# Patient Record
Sex: Male | Born: 1955 | Race: White | Hispanic: No | Marital: Married | Smoking: Current every day smoker
Health system: Southern US, Community
[De-identification: ages and names within clinical notes are randomized; demographics above are authoritative.]

## PROBLEM LIST (undated history)

## (undated) DIAGNOSIS — G4730 Sleep apnea, unspecified: Secondary | ICD-10-CM

## (undated) DIAGNOSIS — I10 Essential (primary) hypertension: Secondary | ICD-10-CM

## (undated) DIAGNOSIS — I890 Lymphedema, not elsewhere classified: Secondary | ICD-10-CM

## (undated) DIAGNOSIS — R000 Tachycardia, unspecified: Secondary | ICD-10-CM

## (undated) DIAGNOSIS — J449 Chronic obstructive pulmonary disease, unspecified: Secondary | ICD-10-CM

## (undated) DIAGNOSIS — E119 Type 2 diabetes mellitus without complications: Secondary | ICD-10-CM

## (undated) DIAGNOSIS — E785 Hyperlipidemia, unspecified: Secondary | ICD-10-CM

## (undated) DIAGNOSIS — I509 Heart failure, unspecified: Secondary | ICD-10-CM

## (undated) DIAGNOSIS — I2510 Atherosclerotic heart disease of native coronary artery without angina pectoris: Secondary | ICD-10-CM

## (undated) DIAGNOSIS — I872 Venous insufficiency (chronic) (peripheral): Secondary | ICD-10-CM

## (undated) HISTORY — PX: CARDIAC CATHETERIZATION: SHX172

## (undated) HISTORY — PX: ADENOIDECTOMY: SUR15

## (undated) HISTORY — PX: TONSILLECTOMY: SUR1361

## (undated) HISTORY — PX: ANGIOPLASTY: SHX39

## (undated) HISTORY — PX: HERNIA REPAIR: SHX51

## (undated) HISTORY — PX: APPENDECTOMY: SHX54

---

## 2007-03-15 ENCOUNTER — Emergency Department: Payer: Self-pay | Admitting: Internal Medicine

## 2007-03-15 ENCOUNTER — Other Ambulatory Visit: Payer: Self-pay

## 2007-03-17 ENCOUNTER — Emergency Department: Payer: Self-pay | Admitting: Emergency Medicine

## 2007-04-02 ENCOUNTER — Inpatient Hospital Stay: Payer: Self-pay | Admitting: Internal Medicine

## 2007-04-13 ENCOUNTER — Inpatient Hospital Stay: Payer: Self-pay | Admitting: Internal Medicine

## 2007-09-26 ENCOUNTER — Ambulatory Visit: Payer: Self-pay | Admitting: Cardiology

## 2008-04-28 ENCOUNTER — Emergency Department: Payer: Self-pay | Admitting: Emergency Medicine

## 2009-05-31 ENCOUNTER — Emergency Department: Payer: Self-pay | Admitting: Emergency Medicine

## 2009-06-09 ENCOUNTER — Emergency Department: Payer: Self-pay | Admitting: Emergency Medicine

## 2009-07-10 ENCOUNTER — Inpatient Hospital Stay: Payer: Self-pay | Admitting: Internal Medicine

## 2011-08-03 ENCOUNTER — Ambulatory Visit: Payer: Self-pay | Admitting: Family Medicine

## 2013-07-02 ENCOUNTER — Ambulatory Visit: Payer: Self-pay | Admitting: Family Medicine

## 2013-09-24 ENCOUNTER — Ambulatory Visit: Payer: Self-pay | Admitting: Cardiology

## 2014-03-13 ENCOUNTER — Ambulatory Visit: Payer: Self-pay | Admitting: Family Medicine

## 2014-03-13 ENCOUNTER — Emergency Department: Payer: Self-pay | Admitting: Emergency Medicine

## 2014-07-04 ENCOUNTER — Emergency Department: Payer: Medicare Other

## 2014-07-04 ENCOUNTER — Encounter: Payer: Self-pay | Admitting: Emergency Medicine

## 2014-07-04 ENCOUNTER — Emergency Department
Admission: EM | Admit: 2014-07-04 | Discharge: 2014-07-04 | Disposition: A | Discharge status: Home / Self Care | Payer: Medicare Other | Attending: Emergency Medicine | Admitting: Emergency Medicine

## 2014-07-04 DIAGNOSIS — I10 Essential (primary) hypertension: Secondary | ICD-10-CM | POA: Insufficient documentation

## 2014-07-04 DIAGNOSIS — Z720 Tobacco use: Secondary | ICD-10-CM | POA: Insufficient documentation

## 2014-07-04 DIAGNOSIS — L03115 Cellulitis of right lower limb: Secondary | ICD-10-CM | POA: Insufficient documentation

## 2014-07-04 DIAGNOSIS — E11628 Type 2 diabetes mellitus with other skin complications: Secondary | ICD-10-CM | POA: Insufficient documentation

## 2014-07-04 DIAGNOSIS — T148XXA Other injury of unspecified body region, initial encounter: Secondary | ICD-10-CM

## 2014-07-04 DIAGNOSIS — R609 Edema, unspecified: Secondary | ICD-10-CM

## 2014-07-04 DIAGNOSIS — L03119 Cellulitis of unspecified part of limb: Secondary | ICD-10-CM

## 2014-07-04 HISTORY — DX: Hyperlipidemia, unspecified: E78.5

## 2014-07-04 HISTORY — DX: Venous insufficiency (chronic) (peripheral): I87.2

## 2014-07-04 HISTORY — DX: Chronic obstructive pulmonary disease, unspecified: J44.9

## 2014-07-04 HISTORY — DX: Sleep apnea, unspecified: G47.30

## 2014-07-04 HISTORY — DX: Essential (primary) hypertension: I10

## 2014-07-04 HISTORY — DX: Atherosclerotic heart disease of native coronary artery without angina pectoris: I25.10

## 2014-07-04 HISTORY — DX: Type 2 diabetes mellitus without complications: E11.9

## 2014-07-04 HISTORY — DX: Heart failure, unspecified: I50.9

## 2014-07-04 HISTORY — DX: Lymphedema, not elsewhere classified: I89.0

## 2014-07-04 LAB — CBC WITH DIFFERENTIAL/PLATELET
BASOS ABS: 0.1 10*3/uL (ref 0–0.1)
BASOS PCT: 1 %
Eosinophils Absolute: 0.2 10*3/uL (ref 0–0.7)
Eosinophils Relative: 2 %
HCT: 51 % (ref 40.0–52.0)
Hemoglobin: 17.1 g/dL (ref 13.0–18.0)
LYMPHS PCT: 14 %
Lymphs Abs: 1.6 10*3/uL (ref 1.0–3.6)
MCH: 33.3 pg (ref 26.0–34.0)
MCHC: 33.6 g/dL (ref 32.0–36.0)
MCV: 99.4 fL (ref 80.0–100.0)
Monocytes Absolute: 0.8 10*3/uL (ref 0.2–1.0)
Monocytes Relative: 7 %
Neutro Abs: 9 10*3/uL — ABNORMAL HIGH (ref 1.4–6.5)
Neutrophils Relative %: 76 %
Platelets: 139 10*3/uL — ABNORMAL LOW (ref 150–440)
RBC: 5.13 MIL/uL (ref 4.40–5.90)
RDW: 14 % (ref 11.5–14.5)
WBC: 11.7 10*3/uL — AB (ref 3.8–10.6)

## 2014-07-04 LAB — BASIC METABOLIC PANEL
Anion gap: 7 (ref 5–15)
BUN: 13 mg/dL (ref 6–20)
CALCIUM: 8.9 mg/dL (ref 8.9–10.3)
CO2: 26 mmol/L (ref 22–32)
CREATININE: 1.21 mg/dL (ref 0.61–1.24)
Chloride: 105 mmol/L (ref 101–111)
GFR calc Af Amer: >60 mL/min (ref >60)
GFR calc non Af Amer: >60 mL/min (ref >60)
Glucose, Bld: 98 mg/dL (ref 65–99)
POTASSIUM: 4.1 mmol/L (ref 3.5–5.1)
SODIUM: 138 mmol/L (ref 135–145)

## 2014-07-04 MED ORDER — CLINDAMYCIN PHOSPHATE 600 MG/50ML IV SOLN
600.0000 mg | Freq: Once | INTRAVENOUS | Status: AC
  Administered 2014-07-04: 600 mg via INTRAVENOUS

## 2014-07-04 MED ORDER — CLINDAMYCIN PHOSPHATE 600 MG/50ML IV SOLN
INTRAVENOUS | Status: AC
  Administered 2014-07-04: 600 mg via INTRAVENOUS
  Filled 2014-07-04: qty 50

## 2014-07-04 MED ORDER — CLINDAMYCIN HCL 300 MG PO CAPS: 300.0000 mg | ORAL_CAPSULE | Freq: Three times a day (TID) | ORAL | Status: DC

## 2014-07-04 MED ORDER — LEVOFLOXACIN IN D5W 750 MG/150ML IV SOLN
750.0000 mg | Freq: Once | INTRAVENOUS | Status: AC
  Administered 2014-07-04: 750 mg via INTRAVENOUS

## 2014-07-04 MED ORDER — LEVOFLOXACIN 750 MG PO TABS: 750.0000 mg | ORAL_TABLET | Freq: Every day | ORAL | Status: DC

## 2014-07-04 MED ORDER — OXYCODONE-ACETAMINOPHEN 5-325 MG PO TABS: 1.0000 | ORAL_TABLET | Freq: Four times a day (QID) | ORAL | Status: DC | PRN

## 2014-07-04 MED ORDER — LEVOFLOXACIN IN D5W 750 MG/150ML IV SOLN
INTRAVENOUS | Status: AC
  Administered 2014-07-04: 750 mg via INTRAVENOUS
  Filled 2014-07-04: qty 150

## 2014-07-04 NOTE — Discharge Instructions (Signed)
Follow up with podiatry, your primary care provider, or return here in 2 days. Because it is a holiday, you will likely have to return here. Return sooner for symptoms that worsen. Take the antibiotics without skipping doses.

## 2014-07-04 NOTE — ED Notes (Signed)
Pt to ed with c/o right foot swelling since Sunday, states he stepped on barbed wire on Sunday and it punctured his right foot.  Pt states since then he has had swelling, redness and warm to touch skin.  Pt was seen at [facility] office today and sent here from there. Pt with extensive medical hx.

## 2014-07-04 NOTE — ED Notes (Signed)
Pt. Did not want pain medication ordered for discharge.  Pt. States he has enough pain medication at home.

## 2014-07-04 NOTE — ED Provider Notes (Signed)
[facility] Emergency Department Provider Note ____________________________________________  Time seen: Approximately 4:35 PM  I have reviewed the triage vital signs and the nursing notes.   HISTORY  Chief Complaint Foot Swelling   HPI Fernando [name] is a 59 y.o. male who presents to the emergency department for pain in the right foot after stepping on a piece of barbed wire on Sunday. He states the swelling and tenderness has gradually worsened over the last few days. He was sent to the emergency room by his PCP. Pain has not been well controlled by medications at home.   Past Medical History  Diagnosis Date  . CAD (coronary artery disease)   . Hyperlipidemia   . Sleep apnea   . COPD (chronic obstructive pulmonary disease)   . CHF (congestive heart failure)   . Hypertension   . Diabetes mellitus without complication   . Lymphedema   . Venous insufficiency     There are no active problems to display for this patient.   History reviewed. No pertinent past surgical history.  Current Outpatient Rx  Name  Route  Sig  Dispense  Refill  . clindamycin (CLEOCIN) 300 MG capsule   Oral   Take 1 capsule (300 mg total) by mouth 3 (three) times daily.   30 capsule   0   . levofloxacin (LEVAQUIN) 750 MG tablet   Oral   Take 1 tablet (750 mg total) by mouth daily.   7 tablet   0   . oxyCODONE-acetaminophen (ROXICET) 5-325 MG per tablet   Oral   Take 1 tablet by mouth every 6 (six) hours as needed.   9 tablet   0     Allergies Augmentin; Codeine; and Other  Family History  Problem Relation Age of Onset  . Cancer Father     Social History History  Substance Use Topics  . Smoking status: Current Every Day Smoker  . Smokeless tobacco: Not on file  . Alcohol Use: No    Review of Systems   Constitutional: No fever/chills Eyes: No visual changes. ENT: No rhinorrhea or sore throat. Cardiovascular: Denies chest pain. Respiratory: Denies  shortness of breath. Gastrointestinal: No abdominal pain.  No nausea, no vomiting.  No diarrhea.  No constipation. Genitourinary: Negative for dysuria. Musculoskeletal: Negative for back pain. Skin: Pain, redness, and swelling to right foot.  Neurological: Negative for headaches, focal weakness or numbness.  10-point ROS otherwise negative.  ____________________________________________   PHYSICAL EXAM:  VITAL SIGNS: ED Triage Vitals  Enc Vitals Group     BP 07/04/14 1506 119/68 mmHg     Pulse Rate 07/04/14 1506 61     Resp 07/04/14 1506 18     Temp 07/04/14 1506 98.4 F (36.9 C)     Temp Source 07/04/14 1506 Oral     SpO2 07/04/14 1506 96 %     Weight 07/04/14 1506 277 lb (125.646 kg)     Height 07/04/14 1506  (1.626 m)     Head Cir --      Peak Flow --      Pain Score 07/04/14 1506 8     Pain Loc --      Pain Edu? --      Excl. in GC? --     Constitutional: Alert and oriented. Chronically ill appearing and in no acute distress. Eyes: Conjunctivae are normal. PERRL. EOMI. Head: Atraumatic. Nose: No congestion/rhinnorhea. Mouth/Throat: Mucous membranes are moist.  Oropharynx non-erythematous. No oral lesions. Neck: No  stridor. Cardiovascular: Normal rate, regular rhythm. Poor vascular status that is chronic with associated skin changes. DP pulse palpable bilaterally. Respiratory: Respiratory effort at baseline. Gastrointestinal: Soft and nontender. Chronically distended. No abdominal bruits.  Musculoskeletal: Chronic lower extremity  edema.  No joint effusions. Neurologic:  Normal speech and language. No gross focal neurologic deficits are appreciated. Speech is normal. No gait instability. Skin:  Rash no; Erythema yes, plantar surface of foot; Negative for petechiae.  Psychiatric: Mood and affect are normal. Speech and behavior are normal.  ____________________________________________   LABS (all labs ordered are listed, but only abnormal results are  displayed)  Labs Reviewed  CBC WITH DIFFERENTIAL/PLATELET - Abnormal; Notable for the following:    WBC 11.7 (*)    Platelets 139 (*)    Neutro Abs 9.0 (*)    All other components within normal limits  CULTURE, BLOOD (ROUTINE X 2)  CULTURE, BLOOD (ROUTINE X 2)  BASIC METABOLIC PANEL   ____________________________________________  EKG   ____________________________________________  RADIOLOGY  No foreign body or bony abnormality. ____________________________________________   PROCEDURES  Procedure(s) performed: None ____________________________________________   INITIAL IMPRESSION / ASSESSMENT AND PLAN / ED COURSE  Pertinent labs & imaging results that were available during my care of the patient were reviewed by me and considered in my medical decision making (see chart for details).  Patient was advised to see the podiatrist, PCP, or return here in 2 days for a recheck. He was advised to return sooner for symptoms that worsen. He was advised to take the antibiotics as scheduled without skipping doses. He will have Rx. For both Levaquin and Clindamycin. He is allergic to PCN/Augmentin. ____________________________________________   FINAL CLINICAL IMPRESSION(S) / ED DIAGNOSES  Final diagnoses:  Cellulitis in diabetic foot       Chinita PesterCari B Elianys Conry, FNP 07/04/14 2025  Loleta Roseory Forbach, MD 07/04/14 2109

## 2014-07-07 ENCOUNTER — Inpatient Hospital Stay
Admission: EM | Admit: 2014-07-07 | Discharge: 2014-07-10 | DRG: 603 | Disposition: A | Discharge status: Home / Self Care | Payer: Medicare Other | Attending: Internal Medicine | Admitting: Internal Medicine

## 2014-07-07 ENCOUNTER — Encounter: Payer: Self-pay | Admitting: Emergency Medicine

## 2014-07-07 ENCOUNTER — Emergency Department: Payer: Medicare Other

## 2014-07-07 DIAGNOSIS — I890 Lymphedema, not elsewhere classified: Secondary | ICD-10-CM | POA: Diagnosis present

## 2014-07-07 DIAGNOSIS — W458XXA Other foreign body or object entering through skin, initial encounter: Secondary | ICD-10-CM | POA: Diagnosis present

## 2014-07-07 DIAGNOSIS — J449 Chronic obstructive pulmonary disease, unspecified: Secondary | ICD-10-CM | POA: Diagnosis present

## 2014-07-07 DIAGNOSIS — S91339A Puncture wound without foreign body, unspecified foot, initial encounter: Secondary | ICD-10-CM

## 2014-07-07 DIAGNOSIS — L03119 Cellulitis of unspecified part of limb: Secondary | ICD-10-CM

## 2014-07-07 DIAGNOSIS — I10 Essential (primary) hypertension: Secondary | ICD-10-CM | POA: Diagnosis present

## 2014-07-07 DIAGNOSIS — Z9109 Other allergy status, other than to drugs and biological substances: Secondary | ICD-10-CM | POA: Diagnosis not present

## 2014-07-07 DIAGNOSIS — E785 Hyperlipidemia, unspecified: Secondary | ICD-10-CM | POA: Diagnosis present

## 2014-07-07 DIAGNOSIS — Z79899 Other long term (current) drug therapy: Secondary | ICD-10-CM

## 2014-07-07 DIAGNOSIS — M7989 Other specified soft tissue disorders: Secondary | ICD-10-CM | POA: Diagnosis present

## 2014-07-07 DIAGNOSIS — E119 Type 2 diabetes mellitus without complications: Secondary | ICD-10-CM | POA: Diagnosis present

## 2014-07-07 DIAGNOSIS — Z7982 Long term (current) use of aspirin: Secondary | ICD-10-CM

## 2014-07-07 DIAGNOSIS — I2510 Atherosclerotic heart disease of native coronary artery without angina pectoris: Secondary | ICD-10-CM | POA: Diagnosis present

## 2014-07-07 DIAGNOSIS — M609 Myositis, unspecified: Secondary | ICD-10-CM | POA: Diagnosis present

## 2014-07-07 DIAGNOSIS — I739 Peripheral vascular disease, unspecified: Secondary | ICD-10-CM | POA: Diagnosis present

## 2014-07-07 DIAGNOSIS — Z9119 Patient's noncompliance with other medical treatment and regimen: Secondary | ICD-10-CM | POA: Diagnosis present

## 2014-07-07 DIAGNOSIS — Z885 Allergy status to narcotic agent status: Secondary | ICD-10-CM

## 2014-07-07 DIAGNOSIS — F17210 Nicotine dependence, cigarettes, uncomplicated: Secondary | ICD-10-CM | POA: Diagnosis present

## 2014-07-07 DIAGNOSIS — G4730 Sleep apnea, unspecified: Secondary | ICD-10-CM | POA: Diagnosis present

## 2014-07-07 DIAGNOSIS — I509 Heart failure, unspecified: Secondary | ICD-10-CM | POA: Diagnosis present

## 2014-07-07 DIAGNOSIS — Z881 Allergy status to other antibiotic agents status: Secondary | ICD-10-CM

## 2014-07-07 DIAGNOSIS — I872 Venous insufficiency (chronic) (peripheral): Secondary | ICD-10-CM | POA: Diagnosis present

## 2014-07-07 DIAGNOSIS — L03115 Cellulitis of right lower limb: Secondary | ICD-10-CM | POA: Diagnosis not present

## 2014-07-07 LAB — COMPREHENSIVE METABOLIC PANEL
ALBUMIN: 3.8 g/dL (ref 3.5–5.0)
ALT: 37 U/L (ref 17–63)
ANION GAP: 8 (ref 5–15)
AST: 33 U/L (ref 15–41)
Alkaline Phosphatase: 109 U/L (ref 38–126)
BILIRUBIN TOTAL: 0.6 mg/dL (ref 0.3–1.2)
BUN: 15 mg/dL (ref 6–20)
CHLORIDE: 101 mmol/L (ref 101–111)
CO2: 30 mmol/L (ref 22–32)
Calcium: 9.5 mg/dL (ref 8.9–10.3)
Creatinine, Ser: 1.13 mg/dL (ref 0.61–1.24)
GLUCOSE: 104 mg/dL — AB (ref 65–99)
POTASSIUM: 3.9 mmol/L (ref 3.5–5.1)
SODIUM: 139 mmol/L (ref 135–145)
TOTAL PROTEIN: 8 g/dL (ref 6.5–8.1)

## 2014-07-07 LAB — CBC
HCT: 52.6 % — ABNORMAL HIGH (ref 40.0–52.0)
HEMOGLOBIN: 17.8 g/dL (ref 13.0–18.0)
MCH: 33.5 pg (ref 26.0–34.0)
MCHC: 33.9 g/dL (ref 32.0–36.0)
MCV: 98.8 fL (ref 80.0–100.0)
PLATELETS: 154 10*3/uL (ref 150–440)
RBC: 5.32 MIL/uL (ref 4.40–5.90)
RDW: 14 % (ref 11.5–14.5)
WBC: 8.1 10*3/uL (ref 3.8–10.6)

## 2014-07-07 LAB — GLUCOSE, CAPILLARY
GLUCOSE-CAPILLARY: 107 mg/dL — AB (ref 65–99)
Glucose-Capillary: 104 mg/dL — ABNORMAL HIGH (ref 65–99)

## 2014-07-07 MED ORDER — NICOTINE 10 MG IN INHA
RESPIRATORY_TRACT | Status: AC
  Administered 2014-07-07: 1 via RESPIRATORY_TRACT
  Filled 2014-07-07: qty 36

## 2014-07-07 MED ORDER — ACETAMINOPHEN 325 MG PO TABS: 650.0000 mg | ORAL_TABLET | Freq: Four times a day (QID) | ORAL | Status: DC | PRN

## 2014-07-07 MED ORDER — VANCOMYCIN HCL IN DEXTROSE 1-5 GM/200ML-% IV SOLN
1000.0000 mg | Freq: Two times a day (BID) | INTRAVENOUS | Status: AC
  Administered 2014-07-08 – 2014-07-09 (×4): 1000 mg via INTRAVENOUS
  Filled 2014-07-07 (×6): qty 200

## 2014-07-07 MED ORDER — NICOTINE 21 MG/24HR TD PT24
21.0000 mg | MEDICATED_PATCH | Freq: Every day | TRANSDERMAL | Status: DC
  Filled 2014-07-07 (×2): qty 1

## 2014-07-07 MED ORDER — CEFAZOLIN SODIUM 1-5 GM-% IV SOLN
1.0000 g | Freq: Three times a day (TID) | INTRAVENOUS | Status: DC
  Administered 2014-07-07 – 2014-07-10 (×8): 1 g via INTRAVENOUS
  Filled 2014-07-07 (×12): qty 50

## 2014-07-07 MED ORDER — POTASSIUM CHLORIDE CRYS ER 10 MEQ PO TBCR
10.0000 meq | EXTENDED_RELEASE_TABLET | Freq: Every day | ORAL | Status: DC
  Administered 2014-07-08 – 2014-07-10 (×3): 10 meq via ORAL
  Filled 2014-07-07 (×3): qty 1

## 2014-07-07 MED ORDER — SODIUM CHLORIDE 0.9 % IJ SOLN
3.0000 mL | Freq: Two times a day (BID) | INTRAMUSCULAR | Status: DC
  Administered 2014-07-07 – 2014-07-10 (×6): 3 mL via INTRAVENOUS

## 2014-07-07 MED ORDER — SODIUM CHLORIDE 0.9 % IV SOLN: 250.0000 mL | INTRAVENOUS | Status: DC | PRN

## 2014-07-07 MED ORDER — VANCOMYCIN HCL IN DEXTROSE 1-5 GM/200ML-% IV SOLN
1000.0000 mg | INTRAVENOUS | Status: AC
  Administered 2014-07-07: 1000 mg via INTRAVENOUS

## 2014-07-07 MED ORDER — ONDANSETRON HCL 4 MG/2ML IJ SOLN: 4.0000 mg | Freq: Four times a day (QID) | INTRAMUSCULAR | Status: DC | PRN

## 2014-07-07 MED ORDER — NICOTINE 10 MG IN INHA
1.0000 | RESPIRATORY_TRACT | Status: DC | PRN
  Administered 2014-07-07: 1 via RESPIRATORY_TRACT

## 2014-07-07 MED ORDER — VANCOMYCIN HCL IN DEXTROSE 1-5 GM/200ML-% IV SOLN
INTRAVENOUS | Status: AC
  Administered 2014-07-07: 1000 mg via INTRAVENOUS
  Filled 2014-07-07: qty 200

## 2014-07-07 MED ORDER — DIGOXIN 125 MCG PO TABS
125.0000 ug | ORAL_TABLET | Freq: Every day | ORAL | Status: DC
  Administered 2014-07-08 – 2014-07-10 (×3): 125 ug via ORAL
  Filled 2014-07-07 (×3): qty 1

## 2014-07-07 MED ORDER — TIOTROPIUM BROMIDE MONOHYDRATE 18 MCG IN CAPS
18.0000 ug | ORAL_CAPSULE | Freq: Every day | RESPIRATORY_TRACT | Status: DC
  Administered 2014-07-08 – 2014-07-10 (×3): 18 ug via RESPIRATORY_TRACT
  Filled 2014-07-07: qty 5

## 2014-07-07 MED ORDER — LOSARTAN POTASSIUM 50 MG PO TABS
50.0000 mg | ORAL_TABLET | Freq: Every day | ORAL | Status: DC
  Administered 2014-07-08 – 2014-07-10 (×3): 50 mg via ORAL
  Filled 2014-07-07 (×3): qty 1

## 2014-07-07 MED ORDER — SODIUM CHLORIDE 0.9 % IJ SOLN
3.0000 mL | INTRAMUSCULAR | Status: DC | PRN
  Administered 2014-07-08 – 2014-07-09 (×2): 3 mL via INTRAVENOUS
  Filled 2014-07-07 (×2): qty 10

## 2014-07-07 MED ORDER — ONDANSETRON HCL 4 MG PO TABS: 4.0000 mg | ORAL_TABLET | Freq: Four times a day (QID) | ORAL | Status: DC | PRN

## 2014-07-07 MED ORDER — CARVEDILOL 3.125 MG PO TABS
3.1250 mg | ORAL_TABLET | Freq: Two times a day (BID) | ORAL | Status: DC
  Administered 2014-07-08 – 2014-07-10 (×5): 3.125 mg via ORAL
  Filled 2014-07-07 (×6): qty 1

## 2014-07-07 MED ORDER — CEFAZOLIN SODIUM 1-5 GM-% IV SOLN
1.0000 g | Freq: Once | INTRAVENOUS | Status: AC
  Administered 2014-07-07: 1 g via INTRAVENOUS

## 2014-07-07 MED ORDER — TRIAMCINOLONE ACETONIDE 0.1 % EX CREA
1.0000 "application " | TOPICAL_CREAM | Freq: Every day | CUTANEOUS | Status: DC | PRN
  Filled 2014-07-07: qty 15

## 2014-07-07 MED ORDER — ASPIRIN EC 81 MG PO TBEC
81.0000 mg | DELAYED_RELEASE_TABLET | Freq: Every day | ORAL | Status: DC
  Administered 2014-07-08 – 2014-07-10 (×3): 81 mg via ORAL
  Filled 2014-07-07 (×3): qty 1

## 2014-07-07 MED ORDER — INSULIN ASPART 100 UNIT/ML ~~LOC~~ SOLN: 0.0000 [IU] | Freq: Three times a day (TID) | SUBCUTANEOUS | Status: DC

## 2014-07-07 MED ORDER — NICOTINE 21 MG/24HR TD PT24
MEDICATED_PATCH | TRANSDERMAL | Status: AC
  Filled 2014-07-07: qty 1

## 2014-07-07 MED ORDER — ACETAMINOPHEN 650 MG RE SUPP: 650.0000 mg | Freq: Four times a day (QID) | RECTAL | Status: DC | PRN

## 2014-07-07 MED ORDER — ENOXAPARIN SODIUM 40 MG/0.4ML ~~LOC~~ SOLN
40.0000 mg | SUBCUTANEOUS | Status: DC
  Administered 2014-07-08 – 2014-07-10 (×3): 40 mg via SUBCUTANEOUS
  Filled 2014-07-07 (×3): qty 0.4

## 2014-07-07 MED ORDER — CEFAZOLIN SODIUM 1-5 GM-% IV SOLN
INTRAVENOUS | Status: AC
  Administered 2014-07-07: 1 g via INTRAVENOUS
  Filled 2014-07-07: qty 50

## 2014-07-07 NOTE — Progress Notes (Signed)
ANTIBIOTIC CONSULT NOTE - INITIAL  Pharmacy Consult for Vancomycin Indication: Cellulitis   Allergies  Allergen Reactions  . Augmentin [Amoxicillin-Pot Clavulanate] Hives  . Codeine Hives  . Other Rash    methialate    Patient Measurements: Height: 5\' 4"  (162.6 cm) Weight: 277 lb (125.646 kg) IBW/kg (Calculated) : 59.2 Adjusted Body Weight: 86 kg   Vital Signs: Temp: 98.1 F (36.7 C) (07/04 1000) Temp Source: Oral (07/04 1000) BP: 127/63 mmHg (07/04 1000) Pulse Rate: 84 (07/04 1000) Intake/Output from previous day:   Intake/Output from this shift:    Labs:  Recent Labs  07/04/14 1520 07/07/14 1054  WBC 11.7* 8.1  HGB 17.1 17.8  PLT 139* 154  CREATININE 1.21 1.13   Estimated Creatinine Clearance: 85.4 mL/min (by C-G formula based on Cr of 1.13). No results for input(s): VANCOTROUGH, VANCOPEAK, VANCORANDOM, GENTTROUGH, GENTPEAK, GENTRANDOM, TOBRATROUGH, TOBRAPEAK, TOBRARND, AMIKACINPEAK, AMIKACINTROU, AMIKACIN in the last 72 hours.   Microbiology: Recent Results (from the past 720 hour(s))  Blood culture (routine x 2)     Status: None (Preliminary result)   Collection Time: 07/04/14  3:20 PM  Result Value Ref Range Status   Specimen Description BLOOD  Final   Special Requests Normal  Final   Culture NO GROWTH 3 DAYS  Final   Report Status PENDING  Incomplete  Blood culture (routine x 2)     Status: None (Preliminary result)   Collection Time: 07/04/14  3:30 PM  Result Value Ref Range Status   Specimen Description BLOOD  Final   Special Requests Normal  Final   Culture NO GROWTH 3 DAYS  Final   Report Status PENDING  Incomplete    Medical History: Past Medical History  Diagnosis Date  . CAD (coronary artery disease)   . Hyperlipidemia   . Sleep apnea   . COPD (chronic obstructive pulmonary disease)   . CHF (congestive heart failure)   . Hypertension   . Diabetes mellitus without complication   . Lymphedema   . Venous insufficiency      Medications:  Scheduled:  . insulin aspart  0-9 Units Subcutaneous TID WC  . nicotine  21 mg Transdermal Daily   Assessment: PK parameters  Ke= 0.075; t1/2: 10 hours; VD: 60.2 L   Patient being treated for cellulitis. Patient is also being treated with cefazolin. Per MD note would like patient on both for now.   Goal of Therapy:  Vancomycin trough level 10-15 mcg/ml  Plan:   Will give Vancomycin 1000 mg IV x 1 then will start Vancomycin 1000 mg IV q 12 hours. Will order Vancomycin trough level prior to the 1000 dose on 7/6.  Follow up culture results  Tres Grzywacz D 07/07/2014,2:43 PM

## 2014-07-07 NOTE — Plan of Care (Signed)
Problem: Discharge Progression Outcomes Goal: Other Discharge Outcomes/Goals Outcome: Progressing Lives at home with his wife. Hx of CAD, PVD, borderline DM, COPD, CHF, HTN, lytic chronic lymphedema, and current smoker controlled with home meds.  Plan of care progress to goal: Cellulitis: IV ABX infusing. Pts foot is red and warm to touch. 7/10 pain, controlled with tylenol. Afebrile. Wife at the bedside.

## 2014-07-07 NOTE — ED Provider Notes (Signed)
[facility] Emergency Department Provider Note  ____________________________________________  Time seen: On arrival  I have reviewed the triage vital signs and the nursing notes.   HISTORY  Chief Complaint Foot Pain and Leg Swelling    HPI Kandis MannanStephen Pettinger is a 59 y.o. male with multiple medical problems who presents with right foot pain. Patient recently seen after stepping on a piece of barb wire and was put on antibiotics approximately 3 days ago. He reports swelling has worsened and pain has worsened. He denies fevers chills. The pain is limited to the top of his foot . He has a history of peripheral vascular disease     Past Medical History  Diagnosis Date  . CAD (coronary artery disease)   . Hyperlipidemia   . Sleep apnea   . COPD (chronic obstructive pulmonary disease)   . CHF (congestive heart failure)   . Hypertension   . Diabetes mellitus without complication   . Lymphedema   . Venous insufficiency     There are no active problems to display for this patient.   History reviewed. No pertinent past surgical history.  Current Outpatient Rx  Name  Route  Sig  Dispense  Refill  . clindamycin (CLEOCIN) 300 MG capsule   Oral   Take 1 capsule (300 mg total) by mouth 3 (three) times daily.   30 capsule   0   . levofloxacin (LEVAQUIN) 750 MG tablet   Oral   Take 1 tablet (750 mg total) by mouth daily.   7 tablet   0   . oxyCODONE-acetaminophen (ROXICET) 5-325 MG per tablet   Oral   Take 1 tablet by mouth every 6 (six) hours as needed.   9 tablet   0     Allergies Augmentin; Codeine; and Other  Family History  Problem Relation Age of Onset  . Cancer Father     Social History History  Substance Use Topics  . Smoking status: Current Every Day Smoker -- 2.00 packs/day    Types: Cigarettes  . Smokeless tobacco: Not on file  . Alcohol Use: No    Review of Systems  Constitutional: Negative for fever. Eyes: Negative for  visual changes. ENT: Negative for sore throat Cardiovascular: Negative for chest pain. Respiratory: Chronic shortness of breath Gastrointestinal: Negative for abdominal pain, vomiting and diarrhea. Genitourinary: Negative for dysuria. Musculoskeletal: Negative for back pain. Skin: Erythema to foot Neurological: Negative for headaches or focal weakness Psychiatric: No anxiety  10-point ROS otherwise negative.  ____________________________________________   PHYSICAL EXAM:  VITAL SIGNS: ED Triage Vitals  Enc Vitals Group     BP 07/07/14 1000 127/63 mmHg     Pulse Rate 07/07/14 1000 84     Resp 07/07/14 1000 20     Temp 07/07/14 1000 98.1 F (36.7 C)     Temp Source 07/07/14 1000 Oral     SpO2 07/07/14 1000 98 %     Weight 07/07/14 1000 277 lb (125.646 kg)     Height 07/07/14 1000 5\' 4"  (1.626 m)     Head Cir --      Peak Flow --      Pain Score 07/07/14 1002 8     Pain Loc --      Pain Edu? --      Excl. in GC? --      Constitutional: Alert and oriented. Chronically ill-appearing Eyes: Conjunctivae are normal.  ENT   Head: Normocephalic and atraumatic.   Mouth/Throat: Mucous membranes are  moist. Cardiovascular: Normal rate, regular rhythm. Normal and symmetric distal pulses are present in all extremities. No murmurs, rubs, or gallops. Respiratory: Normal respiratory effort without tachypnea nor retractions. Breath sounds are clear and equal bilaterally.  Gastrointestinal: Soft and non-tender in all quadrants. No distention. There is no CVA tenderness. Genitourinary: deferred Musculoskeletal: Nontender with normal range of motion in all extremities. Mild swelling to the dorsal surface of the right forefoot, tenderness to palpation although no fluctuance. Erythema to the area Neurologic:  Normal speech and language. No gross focal neurologic deficits are appreciated. Skin:  Skin is warm, dry and intact. As above Psychiatric: Mood and affect are normal. Patient  exhibits appropriate insight and judgment.  ____________________________________________    LABS (pertinent positives/negatives)  Labs Reviewed  CBC - Abnormal; Notable for the following:    HCT 52.6 (*)    All other components within normal limits  COMPREHENSIVE METABOLIC PANEL - Abnormal; Notable for the following:    Glucose, Bld 104 (*)    All other components within normal limits    ____________________________________________   EKG  None  ____________________________________________    RADIOLOGY I have personally reviewed any xrays that were ordered on this patient: X-ray shows worsening swelling the dorsum of the foot  ____________________________________________   PROCEDURES  Procedure(s) performed: none  Critical Care performed: none  ____________________________________________   INITIAL IMPRESSION / ASSESSMENT AND PLAN / ED COURSE  Pertinent labs & imaging results that were available during my care of the patient were reviewed by me and considered in my medical decision making (see chart for details).  Consideration for worsening cellulitis despite by mouth antibiotics. We will check labs and repeat x-ray ----------------------------------------- 1:22 PM on 07/07/2014 -----------------------------------------  Labs unremarkable however patient has failed outpatient treatment for antibiotics given his diabetes and multiple comorbidities feel he needs to be admitted for IV antibiotics. We will start IV Ancef ____________________________________________   FINAL CLINICAL IMPRESSION(S) / ED DIAGNOSES  Final diagnoses:  Cellulitis and abscess of foot     Jene Every, MD 07/07/14 1325

## 2014-07-07 NOTE — Plan of Care (Signed)
Problem: Discharge Progression Outcomes Goal: Discharge plan in place and appropriate Individualization: Lives at home with his wife. Hx of CAD, PVD, borderline DM, COPD, CHF, HTN, lytic chronic lymphedema, and current smoker controlled with home meds

## 2014-07-07 NOTE — H&P (Addendum)
[facility] [place] [facility]   PATIENT NAME: Fernando [name]    MR#:  [id number]  DATE OF BIRTH:  12/04/1955  DATE OF ADMISSION:  07/07/2014  PRIMARY CARE PHYSICIAN: Rolm Gala, MD   REQUESTING/REFERRING PHYSICIAN:  Jene Every  CHIEF COMPLAINT:   Chief Complaint  Patient presents with  . Foot Pain  . Leg Swelling    HISTORY OF PRESENT ILLNESS: Fernando [name]  is a 59 y.o. male with a known history of  coronary artery disease, hyperlipidemia, sleep apnea, COPD, congestive heart failure, hypertension, diabetes borderline, lytic chronic lymphedema, and venous insufficiency who was seen in the emergency room a few days ago after he had stepped on a barb wire and had noticed swelling of his foot. Patient was discharged home on clindamycin and ofloxacin. He continued to have worsening pain and swelling comes back to the emergency room with the right foot being swollen. And warm to touch. He does have history of chronic lymphedema and usually wears Velcro around his lower leg bilaterally. He denies any fevers chills no chest pain or shortness of breath PAST MEDICAL HISTORY:   Past Medical History  Diagnosis Date  . CAD (coronary artery disease)   . Hyperlipidemia   . Sleep apnea   . COPD (chronic obstructive pulmonary disease)   . CHF (congestive heart failure)   . Hypertension   . Diabetes mellitus without complication   . Lymphedema   . Venous insufficiency     PAST SURGICAL HISTORY: History reviewed. No pertinent past surgical history.  SOCIAL HISTORY:  History  Substance Use Topics  . Smoking status: Current Every Day Smoker -- 2.00 packs/day    Types: Cigarettes  . Smokeless tobacco: Not on file  . Alcohol Use: No    FAMILY HISTORY:  Family History  Problem Relation Age of Onset  . Cancer Father     DRUG ALLERGIES:  Allergies  Allergen Reactions  . Augmentin [Amoxicillin-Pot Clavulanate] Hives  . Codeine Hives  . Other Rash     methialate    REVIEW OF SYSTEMS:   CONSTITUTIONAL: No fever, fatigue or weakness.  EYES: No blurred or double vision.  EARS, NOSE, AND THROAT: No tinnitus or ear pain.  RESPIRATORY: No cough, shortness of breath, wheezing or hemoptysis.  CARDIOVASCULAR: No chest pain, orthopnea, edema.  GASTROINTESTINAL: No nausea, vomiting, diarrhea or abdominal pain.  GENITOURINARY: No dysuria, hematuria.  ENDOCRINE: No polyuria, nocturia,  HEMATOLOGY: No anemia, easy bruising or bleeding SKIN: Right foot swelling and erythema MUSCULOSKELETAL: No joint pain or arthritis.   NEUROLOGIC: No tingling, numbness, weakness.  PSYCHIATRY: No anxiety or depression.   MEDICATIONS AT HOME:  Prior to Admission medications   Medication Sig Start Date End Date Taking? Authorizing Provider  clindamycin (CLEOCIN) 300 MG capsule Take 1 capsule (300 mg total) by mouth 3 (three) times daily. 07/04/14   Chinita Pester, FNP  levofloxacin (LEVAQUIN) 750 MG tablet Take 1 tablet (750 mg total) by mouth daily. 07/04/14 07/11/14  Chinita Pester, FNP  oxyCODONE-acetaminophen (ROXICET) 5-325 MG per tablet Take 1 tablet by mouth every 6 (six) hours as needed. 07/04/14   Chinita Pester, FNP      PHYSICAL EXAMINATION:   VITAL SIGNS: Blood pressure 134/77, pulse 81, temperature 98.2 F (36.8 C), temperature source Oral, resp. rate 20, height  (1.626 m), weight 125.646 kg (277 lb), SpO2 96 %.  GENERAL:  59 y.o.-year-old obese male lying in the bed with no acute distress.  EYES: Pupils equal, round, reactive to light and accommodation. No scleral icterus. Extraocular muscles intact.  HEENT: Head atraumatic, normocephalic. Oropharynx and nasopharynx clear.  NECK:  Supple, no jugular venous distention. No thyroid enlargement, no tenderness.  LUNGS: Normal breath sounds bilaterally, no wheezing, rales,rhonchi or crepitation. No use of accessory muscles of respiration.  CARDIOVASCULAR: S1, S2 normal. No murmurs, rubs, or  gallops.  ABDOMEN: Soft, nontender, nondistended. Bowel sounds present. No organomegaly or mass.  EXTREMITIES: Right foot is swollen and erythematous, warm to touch there is a very small puncture wound at the plantar aspect of the right foot NEUROLOGIC: Cranial nerves II through XII are intact. Muscle strength 5/5 in all extremities. Sensation intact. Gait not checked.  PSYCHIATRIC: The patient is alert and oriented x 3.  SKIN: No obvious rash, lesion, or ulcer.   LABORATORY PANEL:   CBC  Recent Labs Lab 07/04/14 1520 07/07/14 1054  WBC 11.7* 8.1  HGB 17.1 17.8  HCT 51.0 52.6*  PLT 139* 154  MCV 99.4 98.8  MCH 33.3 33.5  MCHC 33.6 33.9  RDW 14.0 14.0  LYMPHSABS 1.6  --   MONOABS 0.8  --   EOSABS 0.2  --   BASOSABS 0.1  --    ------------------------------------------------------------------------------------------------------------------  Chemistries   Recent Labs Lab 07/04/14 1520 07/07/14 1054  NA 138 139  K 4.1 3.9  CL 105 101  CO2 26 30  GLUCOSE 98 104*  BUN 13 15  CREATININE 1.21 1.13  CALCIUM 8.9 9.5  AST  --  33  ALT  --  37  ALKPHOS  --  109  BILITOT  --  0.6   ------------------------------------------------------------------------------------------------------------------ estimated creatinine clearance is 85.4 mL/min (by C-G formula based on Cr of 1.13). ------------------------------------------------------------------------------------------------------------------ No results for input(s): TSH, T4TOTAL, T3FREE, THYROIDAB in the last 72 hours.  Invalid input(s): FREET3   Coagulation profile No results for input(s): INR, PROTIME in the last 168 hours. ------------------------------------------------------------------------------------------------------------------- No results for input(s): DDIMER in the last 72 hours. -------------------------------------------------------------------------------------------------------------------  Cardiac  Enzymes No results for input(s): CKMB, TROPONINI, MYOGLOBIN in the last 168 hours.  Invalid input(s): CK ------------------------------------------------------------------------------------------------------------------ Invalid input(s): POCBNP  ---------------------------------------------------------------------------------------------------------------  Urinalysis No results found for: COLORURINE, APPEARANCEUR, LABSPEC, PHURINE, GLUCOSEU, HGBUR, BILIRUBINUR, KETONESUR, PROTEINUR, UROBILINOGEN, NITRITE, LEUKOCYTESUR   RADIOLOGY: Dg Foot 2 Views Right  07/07/2014   CLINICAL DATA:  Right foot infection, worsening.  EXAM: RIGHT FOOT - 2 VIEW  COMPARISON:  07/04/2014  FINDINGS: Diffuse soft tissue swelling throughout the foot, most pronounced along the dorsum, increased since prior study. No underlying acute bony abnormality. No acute fracture, subluxation or dislocation. No radiographic changes of osteomyelitis.  IMPRESSION: Worsening soft tissue swelling along the dorsum of the foot. No acute bony abnormality.   Electronically Signed   By: Charlett Nose M.D.   On: 07/07/2014 10:52    EKG: No orders found for this or any previous visit.  IMPRESSION AND PLAN: Patient is a 59 year old white male with history of morbid obesity, coronary artery disease, hyperlipidemia, sleep apnea and chronic lymphedema presents with right foot swelling and erythema  1. Right foot cellulitis: At this time will treat patient with vancomycin and Ancef. If he doesn't improve then consider MRI of the foot but the puncture wound is very minimal. He has had a history of having tetanus shot four years ago  2. Sleep apnea not compliant with his CPAP machine states that is broken for 8 months  3. Coronary artery disease continue aspirin and carvedilol and digitoxin  4. History of congestive heart failure currently compensated monitor his respiratory status and fluid status continue digoxin  5. COPD continue  Spiriva  6. Nicotine addiction smoking cessation done 4 minutes spent recommended patient stop smoking he states that he is not interested in quitting but does is interested in using nicotine inhaler while in the hospital  All the records are reviewed and case discussed with ED provider. Management plans discussed with the patient, family and they are in agreement.  CODE STATUS: Full    TOTAL TIME TAKING CARE OF THIS PATIENT:55 minutes.    Auburn BilberryPATEL, Kaileena Obi M.D on 07/07/2014 at 5:52 PM  Between 7am to 6pm - Pager - [phone]  After 6pm go to [email or web address] - password [email or web address]  [facility]  Office  [phone]  CC: Primary care physician; Rolm GalaGRANDIS, HEIDI, MD

## 2014-07-07 NOTE — ED Notes (Signed)
Pt had some barbed wire go thru the bottom of his right foot 2 weeks ago, foot began swelling a week ago, was seen here last week for the same, started on abx Sat am, now states foot is more swollen and more painful.

## 2014-07-08 LAB — BASIC METABOLIC PANEL
Anion gap: 8 (ref 5–15)
BUN: 12 mg/dL (ref 6–20)
CALCIUM: 8.8 mg/dL — AB (ref 8.9–10.3)
CO2: 27 mmol/L (ref 22–32)
CREATININE: 1.01 mg/dL (ref 0.61–1.24)
Chloride: 105 mmol/L (ref 101–111)
GFR calc Af Amer: >60 mL/min (ref >60)
GFR calc non Af Amer: >60 mL/min (ref >60)
GLUCOSE: 101 mg/dL — AB (ref 65–99)
Potassium: 4.2 mmol/L (ref 3.5–5.1)
Sodium: 140 mmol/L (ref 135–145)

## 2014-07-08 LAB — GLUCOSE, CAPILLARY
GLUCOSE-CAPILLARY: 176 mg/dL — AB (ref 65–99)
Glucose-Capillary: 105 mg/dL — ABNORMAL HIGH (ref 65–99)
Glucose-Capillary: 121 mg/dL — ABNORMAL HIGH (ref 65–99)
Glucose-Capillary: 142 mg/dL — ABNORMAL HIGH (ref 65–99)
Glucose-Capillary: 156 mg/dL — ABNORMAL HIGH (ref 65–99)

## 2014-07-08 LAB — CBC
HEMATOCRIT: 48.2 % (ref 40.0–52.0)
Hemoglobin: 16.1 g/dL (ref 13.0–18.0)
MCH: 32.8 pg (ref 26.0–34.0)
MCHC: 33.4 g/dL (ref 32.0–36.0)
MCV: 98.4 fL (ref 80.0–100.0)
Platelets: 147 10*3/uL — ABNORMAL LOW (ref 150–440)
RBC: 4.9 MIL/uL (ref 4.40–5.90)
RDW: 13.7 % (ref 11.5–14.5)
WBC: 9.6 10*3/uL (ref 3.8–10.6)

## 2014-07-08 MED ORDER — ZOLPIDEM TARTRATE 5 MG PO TABS
5.0000 mg | ORAL_TABLET | Freq: Every evening | ORAL | Status: DC | PRN
Start: 2014-07-08 — End: 2014-07-10
  Administered 2014-07-08 – 2014-07-09 (×3): 5 mg via ORAL
  Filled 2014-07-08 (×3): qty 1

## 2014-07-08 NOTE — Plan of Care (Addendum)
Problem: Discharge Progression Outcomes Goal: Other Discharge Outcomes/Goals Outcome: Progressing Plan of care progress to goal: Cellulitis: IV ABX infusing. Pts foot remains red and warm to touch. No c/o pain. Afebrile. Refusing insulin. MD aware. Okay with Dr. Imogene Burnhen that patient is refusing insulin

## 2014-07-08 NOTE — Progress Notes (Signed)
Pt very upset and fussing with wife about times his medication are due.  Explained to pt that this was an easy fix, that we could change medication times to the times he takes them at home.  Pt requesting a new IV.  Pt very rude and pointing finger at this RN.  Message sent to pharmacy to get times change.  Nurse Supervisor on floor.  Made her aware of pt's behavior and she went in to change pt's IV.  Will continue to monitor.  Orson Apeanielle Zahria Ding, RN

## 2014-07-08 NOTE — Progress Notes (Signed)
Spoke with Dr. Clint GuyHower due to pt's request for something to help his sleep . MD to put in new order.  Will continue to monitor. Orson Apeanielle Lasaundra Riche, RN

## 2014-07-08 NOTE — Care Management (Signed)
Admitted to [facility] with cellulitis of the right foot. Presented to the emergency room 07/04/14 with the diagnosis of cellulitis of right foot after stepping on a barb wire fence and puncturing the bottom of his foot. Sent home with antibiotics. Lives with wife, Mitzi DavenportShelby [phone]). Sees Dr. Gavin PottersGrandis. Takes care of all activities of daily living himself and uses no devices for ambulation. Family/friends will transport. Gwenette GreetBrenda S Holland RN MSN Care Management [phone]

## 2014-07-08 NOTE — Consult Note (Signed)
ORTHOPAEDIC CONSULTATION  REQUESTING PHYSICIAN: Shaune Pollack, MD  Chief Complaint: Right foot swelling  HPI: Fernando [name] is a 59 y.o. male who complains of  Recent right foot swelling.  States he stepped on a piece of barbed wire several days ago.  Was wearing a shoe.  Did not notice any bleeding.  Seen in ED and begun on antibiotics but has had worsening swelling and redness.  Admitted for further w/u of swelling with concern for abscess.  Past Medical History  Diagnosis Date  . CAD (coronary artery disease)   . Hyperlipidemia   . Sleep apnea   . COPD (chronic obstructive pulmonary disease)   . CHF (congestive heart failure)   . Hypertension   . Diabetes mellitus without complication   . Lymphedema   . Venous insufficiency    History reviewed. No pertinent past surgical history. History   Social History  . Marital Status: Married    Spouse Name: N/A  . Number of Children: N/A  . Years of Education: N/A   Social History Main Topics  . Smoking status: Current Every Day Smoker -- 2.00 packs/day    Types: Cigarettes  . Smokeless tobacco: Not on file  . Alcohol Use: No  . Drug Use: No  . Sexual Activity: Not on file   Other Topics Concern  . None   Social History Narrative   Family History  Problem Relation Age of Onset  . Cancer Father    Allergies  Allergen Reactions  . Augmentin [Amoxicillin-Pot Clavulanate] Hives  . Codeine Hives  . Other Rash    methialate   Prior to Admission medications   Medication Sig Start Date End Date Taking? Authorizing Provider  acetaminophen (TYLENOL) 500 MG tablet Take 1,500 mg by mouth every 6 (six) hours as needed. For  Pain.   Yes Historical Provider, MD  aspirin EC 81 MG tablet Take 81 mg by mouth daily.   Yes Historical Provider, MD  carvedilol (COREG) 3.125 MG tablet Take 3.125 mg by mouth 2 (two) times daily. 03/27/14  Yes Historical Provider, MD  clindamycin (CLEOCIN) 300 MG capsule Take 1 capsule (300 mg total) by  mouth 3 (three) times daily. 07/04/14  Yes Cari B Triplett, FNP  cyanocobalamin (,VITAMIN B-12,) 1000 MCG/ML injection Inject 1,000 mcg into the muscle every 30 (thirty) days. 11/01/10  Yes Historical Provider, MD  digoxin (LANOXIN) 0.125 MG tablet Take 125 mcg by mouth daily. 06/12/14 12/10/14 Yes Historical Provider, MD  EPINEPHrine (EPIPEN 2-PAK) 0.3 mg/0.3 mL IJ SOAJ injection Inject 0.3 mg as directed daily as needed. For allergic reaction. 03/13/14  Yes Historical Provider, MD  levofloxacin (LEVAQUIN) 750 MG tablet Take 1 tablet (750 mg total) by mouth daily. 07/04/14 07/11/14 Yes Cari B Triplett, FNP  losartan (COZAAR) 50 MG tablet Take 50 mg by mouth daily. 03/26/14  Yes Historical Provider, MD  potassium chloride (MICRO-K) 10 MEQ CR capsule Take 10 mEq by mouth daily. 01/13/14 01/14/15 Yes Historical Provider, MD  tiotropium (SPIRIVA HANDIHALER) 18 MCG inhalation capsule Place 18 mcg into inhaler and inhale daily. 01/13/14 01/14/15 Yes Historical Provider, MD  triamcinolone cream (KENALOG) 0.1 % Apply 1 application topically daily as needed (for leg rash.).  01/13/14  Yes Historical Provider, MD  meloxicam (MOBIC) 15 MG tablet Take 15 mg by mouth daily as needed. With food for pain. 12/09/13   Historical Provider, MD   Dg Foot 2 Views Right  07/07/2014   CLINICAL DATA:  Right foot infection, worsening.  EXAM: RIGHT  FOOT - 2 VIEW  COMPARISON:  07/04/2014  FINDINGS: Diffuse soft tissue swelling throughout the foot, most pronounced along the dorsum, increased since prior study. No underlying acute bony abnormality. No acute fracture, subluxation or dislocation. No radiographic changes of osteomyelitis.  IMPRESSION: Worsening soft tissue swelling along the dorsum of the foot. No acute bony abnormality.   Electronically Signed   By: Charlett NoseKevin  Dover M.D.   On: 07/07/2014 10:52    Positive ROS: All other systems have been reviewed and were otherwise negative with the exception of those mentioned in the HPI and as above.   12 point ROS was performed.  Physical Exam: General: Alert and oriented.  No apparent distress.  Vascular:  Left foot:Dorsalis Pedis:  present Posterior Tibial:  present  Right foot: Dorsalis Pedis:  diminished Posterior Tibial:  present  Neuro:intact  Derm: There is diffuse erythema to the right foot. There are no lymphangitic streaks. There are no open puncture sites at this time. No open draining areas at all. He does have venous stasis cellulitis to bilateral lower legs.  Ortho/MS: There is noted focal edema to the right foot compared to the left. Pitting in nature. +2 pitting. He has pain with palpation to the dorsal midfoot. There is mild pain plantarly but more pain dorsally at this time. Appearance with midfoot range of motion. No pain with subtalar or ankle range of motion.   Assessment: Cellulitis right foot. Puncture wound  Plan: He has no obvious sites for puncture wound but his history is concerning for abscess into the foot. X-rays are negative for gas in the soft tissue or obvious osteomyelitis. I recommend an MRI of the foot to evaluate for abscessed areas. If this is negative would treat this with antibiotics. He should elevate and keep compression to this foot and leg. We will follow-up to reevaluate after MRI has been performed.    Irean HongFowler, Kensleigh Gates A, DPM Cell [phone]   07/08/2014 5:07 PM

## 2014-07-08 NOTE — Progress Notes (Signed)
[facility] [place] at [facility]   PATIENT NAME: Fernando [name]    MR#:  [id number]  DATE OF BIRTH:  [birth year]  SUBJECTIVE:  CHIEF COMPLAINT:   Chief Complaint  Patient presents with  . Foot Pain  . Leg Swelling  right foot still swelling and pain.  REVIEW OF SYSTEMS:  CONSTITUTIONAL: No fever, fatigue or weakness.  EYES: No blurred or double vision.  EARS, NOSE, AND THROAT: No tinnitus or ear pain.  RESPIRATORY: No cough, shortness of breath, wheezing or hemoptysis.  CARDIOVASCULAR: No chest pain, orthopnea, edema.  GASTROINTESTINAL: No nausea, vomiting, diarrhea or abdominal pain.  GENITOURINARY: No dysuria, hematuria.  ENDOCRINE: No polyuria, nocturia,  HEMATOLOGY: No anemia, easy bruising or bleeding SKIN: No rash or lesion. MUSCULOSKELETAL: No joint pain or arthritis.  right foot still swelling and pain. NEUROLOGIC: No tingling, numbness, weakness.  PSYCHIATRY: No anxiety or depression.   DRUG ALLERGIES:   Allergies  Allergen Reactions  . Augmentin [Amoxicillin-Pot Clavulanate] Hives  . Codeine Hives  . Other Rash    methialate    VITALS:  Blood pressure 120/67, pulse 80, temperature 98.4 F (36.9 C), temperature source Oral, resp. rate 18, height  (1.626 m), weight 125.646 kg (277 lb), SpO2 95 %.  PHYSICAL EXAMINATION:  GENERAL:  59 y.o.-year-old patient lying in the bed with no acute distress. Obese. EYES: Pupils equal, round, reactive to light and accommodation. No scleral icterus. Extraocular muscles intact.  HEENT: Head atraumatic, normocephalic. Oropharynx and nasopharynx clear.  NECK:  Supple, no jugular venous distention. No thyroid enlargement, no tenderness.  LUNGS: Normal breath sounds bilaterally, no wheezing, rales,rhonchi or crepitation. No use of accessory muscles of respiration.  CARDIOVASCULAR: S1, S2 normal. No murmurs, rubs, or gallops.  ABDOMEN: Soft, nontender, nondistended. Bowel sounds present. No  organomegaly or mass.  EXTREMITIES: Chronic dark skin changes with edema on bilateral legs, no cyanosis, or clubbing. Right foot still swelling, erythema and tenderness. NEUROLOGIC: Cranial nerves II through XII are intact. Muscle strength 5/5 in all extremities. Sensation intact. Gait not checked.  PSYCHIATRIC: The patient is alert and oriented x 3.  SKIN: No obvious rash, lesion, or ulcer.    LABORATORY PANEL:   CBC  Recent Labs Lab 07/08/14 0434  WBC 9.6  HGB 16.1  HCT 48.2  PLT 147*   ------------------------------------------------------------------------------------------------------------------  Chemistries   Recent Labs Lab 07/07/14 1054 07/08/14 0434  NA 139 140  K 3.9 4.2  CL 101 105  CO2 30 27  GLUCOSE 104* 101*  BUN 15 12  CREATININE 1.13 1.01  CALCIUM 9.5 8.8*  AST 33  --   ALT 37  --   ALKPHOS 109  --   BILITOT 0.6  --    ------------------------------------------------------------------------------------------------------------------  Cardiac Enzymes No results for input(s): TROPONINI in the last 168 hours. ------------------------------------------------------------------------------------------------------------------  RADIOLOGY:  Dg Foot 2 Views Right  07/07/2014   CLINICAL DATA:  Right foot infection, worsening.  EXAM: RIGHT FOOT - 2 VIEW  COMPARISON:  07/04/2014  FINDINGS: Diffuse soft tissue swelling throughout the foot, most pronounced along the dorsum, increased since prior study. No underlying acute bony abnormality. No acute fracture, subluxation or dislocation. No radiographic changes of osteomyelitis.  IMPRESSION: Worsening soft tissue swelling along the dorsum of the foot. No acute bony abnormality.   Electronically Signed   By: Charlett Nose M.D.   On: 07/07/2014 10:52    EKG:  No orders found for this or any previous visit.  ASSESSMENT AND PLAN:  1. Right foot cellulitis: need to r/o abcess. Continue vancomycin and Ancef. F/u CBC,  podiatry consult.  2. Sleep apnea. Stable.  3. Coronary artery disease,  continue aspirin and carvedilol and digitoxin  4. History of congestive heart failure,  currently stable. 5. COPD.  continue Spiriva     All the records are reviewed and case discussed with Care Management/Social Workerr. Management plans discussed with the patient, family and they are in agreement.  CODE STATUS: Full code  TOTAL TIME TAKING CARE OF THIS PATIENT: 37 minutes.   POSSIBLE D/C IN 2-3 DAYS, DEPENDING ON CLINICAL CONDITION.   Fernando [name], Fernando [name] M.D on 07/08/2014 at 10:46 AM  Between 7am to 6pm - Pager - [phone]  After 6pm go to [email or web address] - password [email or web address] [facility]  [facility]  Office  [phone]  CC: Primary care physician; Rolm GalaGRANDIS, HEIDI, MD

## 2014-07-08 NOTE — Plan of Care (Addendum)
Problem: Discharge Progression Outcomes Goal: Other Discharge Outcomes/Goals Outcome: Progressing Plan of care progress to goal for: 1. Pain-no c/o pain this shift 2. Hemodynamically-             -VSS, pt remains afebrile this shift             -IV ABT continues 3. Complications-pt c/o not being able to sleep, MD put in new order and medication given, with relief.  Pt c/o medication schedule for home meds, pharmacy notified and times have been adjusted to times requested by pt and his wife.  Pt requested new IV and new IV was placed. 4. Diet-pt tolerating diet at this time 5. Activity-pt up to BR with standby assist, refuses bed alarm at this time, yelled at tech because bed alarm was on.

## 2014-07-09 ENCOUNTER — Inpatient Hospital Stay: Payer: Medicare Other

## 2014-07-09 LAB — GLUCOSE, CAPILLARY
GLUCOSE-CAPILLARY: 102 mg/dL — AB (ref 65–99)
Glucose-Capillary: 110 mg/dL — ABNORMAL HIGH (ref 65–99)
Glucose-Capillary: 142 mg/dL — ABNORMAL HIGH (ref 65–99)
Glucose-Capillary: 143 mg/dL — ABNORMAL HIGH (ref 65–99)

## 2014-07-09 LAB — CBC
HCT: 47.6 % (ref 40.0–52.0)
HEMOGLOBIN: 16 g/dL (ref 13.0–18.0)
MCH: 33.2 pg (ref 26.0–34.0)
MCHC: 33.6 g/dL (ref 32.0–36.0)
MCV: 98.8 fL (ref 80.0–100.0)
Platelets: 155 10*3/uL (ref 150–440)
RBC: 4.82 MIL/uL (ref 4.40–5.90)
RDW: 13.9 % (ref 11.5–14.5)
WBC: 9 10*3/uL (ref 3.8–10.6)

## 2014-07-09 LAB — VANCOMYCIN, TROUGH: VANCOMYCIN TR: 9 ug/mL — AB (ref 10–20)

## 2014-07-09 MED ORDER — VANCOMYCIN HCL 10 G IV SOLR
1250.0000 mg | Freq: Two times a day (BID) | INTRAVENOUS | Status: DC
  Administered 2014-07-09 – 2014-07-10 (×2): 1250 mg via INTRAVENOUS
  Filled 2014-07-09 (×5): qty 1250

## 2014-07-09 MED ORDER — GADOBENATE DIMEGLUMINE 529 MG/ML IV SOLN
20.0000 mL | Freq: Once | INTRAVENOUS | Status: AC | PRN
  Administered 2014-07-09: 09:00:00 20 mL via INTRAVENOUS

## 2014-07-09 NOTE — Progress Notes (Signed)
Daily Progress Note   Subjective  - * No surgery found *  F/u right foot.  MRI today.  Negative for osteomyelitis or abscess.  Questionable metal scatter but not seen on xray.Pt states foot feels better today.  Objective Filed Vitals:   07/08/14 1313 07/08/14 2046 07/09/14 0507 07/09/14 1314  BP: 133/69 136/74 143/74 135/59  Pulse: 78 76 82 78  Temp: 99 F (37.2 C) 98.2 F (36.8 C) 98.6 F (37 C) 97.7 F (36.5 C)  TempSrc: Oral Oral Oral Oral  Resp: 20 18 20 20   Height:      Weight:      SpO2: 97% 96% 92% 95%    Physical Exam: Plantar right foot shows no signs of focal superficial abscess. Tiny superficial possible puncture site evaluated and did not have any open draining areas.  Continued edema to foot.  No focal pain.  Reviewed MRI and xray and both negative.   Laboratory CBC    Component Value Date/Time   WBC 9.0 07/09/2014 0425   HGB 16.0 07/09/2014 0425   HCT 47.6 07/09/2014 0425   PLT 155 07/09/2014 0425    BMET    Component Value Date/Time   NA 140 07/08/2014 0434   K 4.2 07/08/2014 0434   CL 105 07/08/2014 0434   CO2 27 07/08/2014 0434   GLUCOSE 101* 07/08/2014 0434   BUN 12 07/08/2014 0434   CREATININE 1.01 07/08/2014 0434   CALCIUM 8.8* 07/08/2014 0434   GFRNONAA >60 07/08/2014 0434   GFRAA >60 07/08/2014 0434    Assessment/Planning: Possible recent puncture wound with cellulitis   Recommend empirically treating with po antibiotics and close f/u outpt.  No indication for surgery at this time.  Pt states foot is feeling better an is able to ambulate better today.    Gwyneth RevelsFowler, Natalee Tomkiewicz A  07/09/2014, 3:57 PM

## 2014-07-09 NOTE — Progress Notes (Signed)
   07/09/14 1000  Clinical Encounter Type  Visited With Patient and family together  Visit Type Initial  Consult/Referral To Chaplain  Visited with patient and his wife.  Patient said he was doing better and was told that he could go home tomorrow.  He told me that he "hopes that will happen."  Pt was pleasant and liked to talk.  Pt and his wife both thanked me for coming to visit with them and wished me a good day.  [employer]bury Automotive GroupChaplain Jamayah Myszka-pager [phone]

## 2014-07-09 NOTE — Plan of Care (Signed)
Problem: Discharge Progression Outcomes Goal: Other Discharge Outcomes/Goals Outcome: Progressing Plan of care progress to goal for: 1. Pain-no c/o pain this shift 2. Hemodynamically-             -VSS, pt remains afebrile this shift             -IV ABT continues 3. Complications-no evidence of this shift 4. Diet-pt tolerating diet at this time 5. Activity-pt up to BR with standby assist, refuses bed alarm at this time        

## 2014-07-09 NOTE — Progress Notes (Signed)
ANTIBIOTIC CONSULT NOTE -FOLLOW UP   Pharmacy Consult for Vancomycin Indication: Cellulitis   Allergies  Allergen Reactions  . Augmentin [Amoxicillin-Pot Clavulanate] Hives  . Codeine Hives  . Other Rash    methialate    Patient Measurements: Height: 5\' 4"  (162.6 cm) Weight: 277 lb (125.646 kg) IBW/kg (Calculated) : 59.2 Adjusted Body Weight: 86 kg   Vital Signs: Temp: 98.6 F (37 C) (07/06 0507) Temp Source: Oral (07/06 0507) BP: 143/74 mmHg (07/06 0507) Pulse Rate: 82 (07/06 0507) Intake/Output from previous day: 07/05 0701 - 07/06 0700 In: 240 [P.O.:240] Out: -  Intake/Output from this shift: Total I/O In: 6 [I.V.:6] Out: -   Labs:  Recent Labs  07/07/14 1054 07/08/14 0434 07/09/14 0425  WBC 8.1 9.6 9.0  HGB 17.8 16.1 16.0  PLT 154 147* 155  CREATININE 1.13 1.01  --    Estimated Creatinine Clearance: 95.6 mL/min (by C-G formula based on Cr of 1.01).  Recent Labs  07/09/14 1047  VANCOTROUGH 9*     Microbiology: Recent Results (from the past 720 hour(s))  Blood culture (routine x 2)     Status: None (Preliminary result)   Collection Time: 07/04/14  3:20 PM  Result Value Ref Range Status   Specimen Description BLOOD  Final   Special Requests Normal  Final   Culture NO GROWTH 4 DAYS  Final   Report Status PENDING  Incomplete  Blood culture (routine x 2)     Status: None (Preliminary result)   Collection Time: 07/04/14  3:30 PM  Result Value Ref Range Status   Specimen Description BLOOD  Final   Special Requests Normal  Final   Culture NO GROWTH 4 DAYS  Final   Report Status PENDING  Incomplete    Medical History: Past Medical History  Diagnosis Date  . CAD (coronary artery disease)   . Hyperlipidemia   . Sleep apnea   . COPD (chronic obstructive pulmonary disease)   . CHF (congestive heart failure)   . Hypertension   . Diabetes mellitus without complication   . Lymphedema   . Venous insufficiency     Medications:  Scheduled:  .  aspirin EC  81 mg Oral Daily  . carvedilol  3.125 mg Oral BID  .  ceFAZolin (ANCEF) IV  1 g Intravenous 3 times per day  . digoxin  125 mcg Oral Daily  . enoxaparin (LOVENOX) injection  40 mg Subcutaneous Q24H  . insulin aspart  0-9 Units Subcutaneous TID WC  . losartan  50 mg Oral Daily  . nicotine  21 mg Transdermal Daily  . potassium chloride  10 mEq Oral Daily  . sodium chloride  3 mL Intravenous Q12H  . tiotropium  18 mcg Inhalation Daily  . vancomycin  1,250 mg Intravenous Q12H  . vancomycin  1,000 mg Intravenous Q12H   Assessment: PK parameters  Ke= 0.075; t1/2: 10 hours; VD: 60.2 L   Patient being treated for cellulitis. Patient is also being treated with cefazolin. Per MD note would like patient on both for now.   Goal of Therapy:  Vancomycin trough level 10-15 mcg/ml  Plan:   Vancomycin level resulted @ 9 mcg/ml. Will change to VAncomycin 1250 mg IV q12 hours.  Vancomycin level ordered to be drawn prior to the 1000 dose on 7/8.  Follow up culture results  Cassey Bacigalupo D 07/09/2014,12:07 PM

## 2014-07-09 NOTE — Progress Notes (Signed)
[facility] - [place] at [facility]   PATIENT NAME: Fernando MannanStephen [name]    MR#:  [id number]  DATE OF BIRTH:  10-Feb-1955  SUBJECTIVE:  CHIEF COMPLAINT:   Chief Complaint  Patient presents with  . Foot Pain  . Leg Swelling  right foot swelling and pain is better.  REVIEW OF SYSTEMS:  CONSTITUTIONAL: No fever, fatigue or weakness.  EYES: No blurred or double vision.  EARS, NOSE, AND THROAT: No tinnitus or ear pain.  RESPIRATORY: No cough, shortness of breath, wheezing or hemoptysis.  CARDIOVASCULAR: No chest pain, orthopnea, edema.  GASTROINTESTINAL: No nausea, vomiting, diarrhea or abdominal pain.  GENITOURINARY: No dysuria, hematuria.  ENDOCRINE: No polyuria, nocturia,  HEMATOLOGY: No anemia, easy bruising or bleeding SKIN: No rash or lesion. MUSCULOSKELETAL: No joint pain or arthritis.  right foot still swelling and pain. NEUROLOGIC: No tingling, numbness, weakness.  PSYCHIATRY: No anxiety or depression.   DRUG ALLERGIES:   Allergies  Allergen Reactions  . Augmentin [Amoxicillin-Pot Clavulanate] Hives  . Codeine Hives  . Other Rash    methialate    VITALS:  Blood pressure 143/74, pulse 82, temperature 98.6 F (37 C), temperature source Oral, resp. rate 20, height 5\' 4"  (1.626 m), weight 125.646 kg (277 lb), SpO2 92 %.  PHYSICAL EXAMINATION:  GENERAL:  59 y.o.-year-old patient lying in the bed with no acute distress. Obese. EYES: Pupils equal, round, reactive to light and accommodation. No scleral icterus. Extraocular muscles intact.  HEENT: Head atraumatic, normocephalic. Oropharynx and nasopharynx clear.  NECK:  Supple, no jugular venous distention. No thyroid enlargement, no tenderness.  LUNGS: Normal breath sounds bilaterally, no wheezing, rales,rhonchi or crepitation. No use of accessory muscles of respiration.  CARDIOVASCULAR: S1, S2 normal. No murmurs, rubs, or gallops.  ABDOMEN: Soft, nontender, nondistended. Bowel sounds present. No  organomegaly or mass.  EXTREMITIES: Chronic dark skin changes with edema on bilateral legs, no cyanosis, or clubbing. Right foot still swelling, erythema and tenderness. NEUROLOGIC: Cranial nerves II through XII are intact. Muscle strength 5/5 in all extremities. Sensation intact. Gait not checked.  PSYCHIATRIC: The patient is alert and oriented x 3.  SKIN: No obvious rash, lesion, or ulcer.    LABORATORY PANEL:   CBC  Recent Labs Lab 07/09/14 0425  WBC 9.0  HGB 16.0  HCT 47.6  PLT 155   ------------------------------------------------------------------------------------------------------------------  Chemistries   Recent Labs Lab 07/07/14 1054 07/08/14 0434  NA 139 140  K 3.9 4.2  CL 101 105  CO2 30 27  GLUCOSE 104* 101*  BUN 15 12  CREATININE 1.13 1.01  CALCIUM 9.5 8.8*  AST 33  --   ALT 37  --   ALKPHOS 109  --   BILITOT 0.6  --    ------------------------------------------------------------------------------------------------------------------  Cardiac Enzymes No results for input(s): TROPONINI in the last 168 hours. ------------------------------------------------------------------------------------------------------------------  RADIOLOGY:  Mr Foot Right W Wo Contrast  07/09/2014   CLINICAL DATA:  Patient stepped on bar wire. Pain and swelling in the foot. Symptoms for 8 days.  EXAM: MRI OF THE RIGHT FOOT WITHOUT AND WITH CONTRAST  TECHNIQUE: Multiplanar, multisequence MR imaging was performed both before and after administration of intravenous contrast. Osteomyelitis whole foot protocol was performed.  CONTRAST:  20mL MULTIHANCE GADOBENATE DIMEGLUMINE 529 MG/ML IV SOLN  COMPARISON:  07/07/2014  FINDINGS: Osteomyelitis protocol MRI of the foot was obtained, to include the entire foot and ankle. This protocol uses a large field of view to cover the entire foot and ankle, and is  suitable for assessing bony structures for osteomyelitis. Due to the large field of view  and imaging plane choice, this protocol is less sensitive for assessing small structures such as ligamentous structures of the foot and ankle, compared to a dedicated forefoot or dedicated hindfoot exam.  Subcutaneous edema and enhancement along dorsum of the foot. Abnormal enhancement tracks within the plantar musculature of the foot.  The fourth metatarsal appears shortened. Metal type susceptibility artifact observed in the vicinity of the fourth metatarsal head and fourth MTP joint tracking from the plantar soft tissues to the dorsal soft tissues as shown on images 29 through 39 of series 9. Although I do not see metal foreign bodies in this vicinity on recent radiographs, the possibility of microscopic metal particles related to prior operative intervention is raised -correlate with prior operative history. The becomes difficult to exclude the possibility of gas in the soft tissues in this vicinity although I favor metal artifact given the appearance.  I do not observe a drainable abscess. No significant abnormal osseous edema or definite osseous enhancement to suggest osteomyelitis.  IMPRESSION: 1. Dorsal subcutaneous edema in the foot favoring cellulitis given the associated enhancement. Low-grade enhancement along the plantar muscular structures suggesting myositis, without a drainable abscess observed. No osseous edema/enhancement to suggest osteomyelitis. 2. Foci of susceptibility artifact extending from the dorsal to the plantar foot in the vicinity of the fourth MTP joint. The fourth metatarsal is chronically shortened and normally I would suspect that the appearance is postoperative due to microscopic metal particles. However, there is no note of any prior operation in the patient's recent consult note or history and physical note. Accordingly the possibility that these foci of low signal intensity might represent gas bubbles in the soft tissues is not entirely discounted. I do not observe gas in the  soft tissues on the radiograph from 07/07/2014. Repeat radiography of the foot to ensure that there are no gas bubbles in this vicinity is recommended.   Electronically Signed   By: Gaylyn Rong M.D.   On: 07/09/2014 09:21    EKG:  No orders found for this or any previous visit.  ASSESSMENT AND PLAN:   1. Right foot cellulitis and myositis: No abscess per foot MRI. Continue vancomycin and Ancef. F/u Dr. Ether Griffins.   2. Sleep apnea. Stable.  3. Coronary artery disease,  continue aspirin and carvedilol and digitoxin  4. History of congestive heart failure,  currently stable. 5. COPD.  continue Spiriva     All the records are reviewed and case discussed with Care Management/Social Workerr. Management plans discussed with the patient, his wife and they are in agreement.  CODE STATUS: Full code  TOTAL TIME TAKING CARE OF THIS PATIENT: 38 minutes.   POSSIBLE D/C HOME TOMORROW, DEPENDING ON CLINICAL CONDITION.   Shaune Pollack M.D on 07/09/2014 at 11:53 AM  Between 7am to 6pm - Pager - [phone]  After 6pm go to [email or web address] - password [email or web address] [facility]  [place] [facility]  Office  [phone]  CC: Primary care physician; Rolm Gala, MD

## 2014-07-09 NOTE — Plan of Care (Signed)
Problem: Discharge Progression Outcomes Goal: Other Discharge Outcomes/Goals Outcome: Progressing Plan of care progress to goal: Cellulitis: IV ABX infusing. Swelling has come down in right foot. Afebrile. Refusing insulin. MD aware. No c/o pain.

## 2014-07-09 NOTE — Care Management (Signed)
Important Message  Patient Details  Name: Fernando [name] MRN: [id number] Date of Birth: [birth year]   Medicare Important Message Given:  Yes-second notification given    Olegario MessierKathy A Allmond 07/09/2014, 11:00 AM

## 2014-07-10 LAB — CULTURE, BLOOD (ROUTINE X 2)
CULTURE: NO GROWTH
Culture: NO GROWTH
SPECIAL REQUESTS: NORMAL
Special Requests: NORMAL

## 2014-07-10 LAB — GLUCOSE, CAPILLARY: GLUCOSE-CAPILLARY: 101 mg/dL — AB (ref 65–99)

## 2014-07-10 MED ORDER — CIPROFLOXACIN HCL 500 MG PO TABS: 500.0000 mg | ORAL_TABLET | Freq: Two times a day (BID) | ORAL | Status: AC

## 2014-07-10 MED ORDER — CLINDAMYCIN HCL 300 MG PO CAPS: 300.0000 mg | ORAL_CAPSULE | Freq: Three times a day (TID) | ORAL | Status: AC

## 2014-07-10 NOTE — Discharge Summary (Signed)
[facility] - [place] at [facility]   PATIENT NAME: Fernando [name]    MR#:  [id number]  DATE OF BIRTH:  09/03/1955  DATE OF ADMISSION:  07/07/2014 ADMITTING PHYSICIAN: Auburn Bilberry, MD  DATE OF DISCHARGE: 07/10/2014 10:05 AM  PRIMARY CARE PHYSICIAN: Rolm Gala, MD    ADMISSION DIAGNOSIS:  Cellulitis of foot [L03.119]   DISCHARGE DIAGNOSIS:  Right foot cellulitis and myositis  SECONDARY DIAGNOSIS:   Past Medical History  Diagnosis Date  . CAD (coronary artery disease)   . Hyperlipidemia   . Sleep apnea   . COPD (chronic obstructive pulmonary disease)   . CHF (congestive heart failure)   . Hypertension   . Diabetes mellitus without complication   . Lymphedema   . Venous insufficiency     HOSPITAL COURSE:   1. Right foot cellulitis and myositis: No abscess per foot MRI. He was treated with vancomycin and Ancef. Dr. Ether Griffins suggested that the patient has no foot abscess. The patient needs clindamycin and Cipro for 10 more days after discharge and follow-up him as outpatient.  2. Sleep apnea. Stable.  3. Coronary artery disease, continue aspirin and carvedilol and digitoxin  4. History of congestive heart failure, currently stable. 5. COPD. continue Spiriva   DISCHARGE CONDITIONS:   Stable. Discharged to home today.  CONSULTS OBTAINED:  Treatment Team:  Auburn Bilberry, MD  DRUG ALLERGIES:   Allergies  Allergen Reactions  . Augmentin [Amoxicillin-Pot Clavulanate] Hives  . Codeine Hives  . Other Rash    methialate    DISCHARGE MEDICATIONS:   Discharge Medication List as of 07/10/2014  9:14 AM    START taking these medications   Details  ciprofloxacin (CIPRO) 500 MG tablet Take 1 tablet (500 mg total) by mouth 2 (two) times daily., Starting 07/10/2014, Until Discontinued, Print      CONTINUE these medications which have CHANGED   Details  clindamycin (CLEOCIN) 300 MG capsule Take 1 capsule (300 mg total) by mouth 3 (three)  times daily., Starting 07/10/2014, Until Discontinued, Print      CONTINUE these medications which have NOT CHANGED   Details  acetaminophen (TYLENOL) 500 MG tablet Take 1,500 mg by mouth every 6 (six) hours as needed. For  Pain., Until Discontinued, Historical Med    aspirin EC 81 MG tablet Take 81 mg by mouth daily., Until Discontinued, Historical Med    carvedilol (COREG) 3.125 MG tablet Take 3.125 mg by mouth 2 (two) times daily., Starting 03/27/2014, Until Discontinued, Historical Med    cyanocobalamin (,VITAMIN B-12,) 1000 MCG/ML injection Inject 1,000 mcg into the muscle every 30 (thirty) days., Starting 11/01/2010, Until Discontinued, Historical Med    digoxin (LANOXIN) 0.125 MG tablet Take 125 mcg by mouth daily., Starting 06/12/2014, Until Wed 12/10/14, Historical Med    EPINEPHrine (EPIPEN 2-PAK) 0.3 mg/0.3 mL IJ SOAJ injection Inject 0.3 mg as directed daily as needed. For allergic reaction., Starting 03/13/2014, Until Discontinued, Historical Med    losartan (COZAAR) 50 MG tablet Take 50 mg by mouth daily., Starting 03/26/2014, Until Discontinued, Historical Med    potassium chloride (MICRO-K) 10 MEQ CR capsule Take 10 mEq by mouth daily., Starting 01/13/2014, Until Wed 01/14/15, Historical Med    tiotropium (SPIRIVA HANDIHALER) 18 MCG inhalation capsule Place 18 mcg into inhaler and inhale daily., Starting 01/13/2014, Until Wed 01/14/15, Historical Med    triamcinolone cream (KENALOG) 0.1 % Apply 1 application topically daily as needed (for leg rash.). , Starting 01/13/2014, Until Discontinued, Historical Med  meloxicam (MOBIC) 15 MG tablet Take 15 mg by mouth daily as needed. With food for pain., Starting 12/09/2013, Until Discontinued, Historical Med      STOP taking these medications     levofloxacin (LEVAQUIN) 750 MG tablet          DISCHARGE INSTRUCTIONS:    If you experience worsening of your admission symptoms, develop shortness of breath, life threatening emergency,  suicidal or homicidal thoughts you must seek medical attention immediately by calling 911 or calling your MD immediately  if symptoms less severe.  You Must read complete instructions/literature along with all the possible adverse reactions/side effects for all the Medicines you take and that have been prescribed to you. Take any new Medicines after you have completely understood and accept all the possible adverse reactions/side effects.   Please note  You were cared for by a hospitalist during your hospital stay. If you have any questions about your discharge medications or the care you received while you were in the hospital after you are discharged, you can call the unit and asked to speak with the hospitalist on call if the hospitalist that took care of you is not available. Once you are discharged, your primary care physician will handle any further medical issues. Please note that NO REFILLS for any discharge medications will be authorized once you are discharged, as it is imperative that you return to your primary care physician (or establish a relationship with a primary care physician if you do not have one) for your aftercare needs so that they can reassess your need for medications and monitor your lab values.    Today   SUBJECTIVE   No complaint   VITAL SIGNS:  Blood pressure 138/77, pulse 86, temperature 98.1 F (36.7 C), temperature source Oral, resp. rate 20, height 5\' 4"  (1.626 m), weight 125.646 kg (277 lb), SpO2 94 %.  I/O:   Intake/Output Summary (Last 24 hours) at 07/10/14 1350 Last data filed at 07/09/14 1800  Gross per 24 hour  Intake    240 ml  Output      0 ml  Net    240 ml    PHYSICAL EXAMINATION:  GENERAL:  59 y.o.-year-old patient lying in the bed with no acute distress. Obese EYES: Pupils equal, round, reactive to light and accommodation. No scleral icterus. Extraocular muscles intact.  HEENT: Head atraumatic, normocephalic. Oropharynx and nasopharynx  clear.  NECK:  Supple, no jugular venous distention. No thyroid enlargement, no tenderness.  LUNGS: Normal breath sounds bilaterally, no wheezing, rales,rhonchi or crepitation. No use of accessory muscles of respiration.  CARDIOVASCULAR: S1, S2 normal. No murmurs, rubs, or gallops.  ABDOMEN: Soft, non-tender, non-distended. Bowel sounds present. No organomegaly or mass.  EXTREMITIES: No cyanosis, or clubbing. Right foot swelling and erythema are better. Chronic skin changes on bilateral lower extremities. NEUROLOGIC: Cranial nerves II through XII are intact. Muscle strength 5/5 in all extremities. Sensation intact. Gait not checked.  PSYCHIATRIC: The patient is alert and oriented x 3.  SKIN: No obvious rash, lesion, or ulcer.   DATA REVIEW:   CBC  Recent Labs Lab 07/09/14 0425  WBC 9.0  HGB 16.0  HCT 47.6  PLT 155    Chemistries   Recent Labs Lab 07/07/14 1054 07/08/14 0434  NA 139 140  K 3.9 4.2  CL 101 105  CO2 30 27  GLUCOSE 104* 101*  BUN 15 12  CREATININE 1.13 1.01  CALCIUM 9.5 8.8*  AST 33  --  ALT 37  --   ALKPHOS 109  --   BILITOT 0.6  --     Cardiac Enzymes No results for input(s): TROPONINI in the last 168 hours.  Microbiology Results  Results for orders placed or performed during the hospital encounter of 07/04/14  Blood culture (routine x 2)     Status: None   Collection Time: 07/04/14  3:20 PM  Result Value Ref Range Status   Specimen Description BLOOD  Final   Special Requests Normal  Final   Culture NO GROWTH 6 DAYS  Final   Report Status 07/10/2014 FINAL  Final  Blood culture (routine x 2)     Status: None   Collection Time: 07/04/14  3:30 PM  Result Value Ref Range Status   Specimen Description BLOOD  Final   Special Requests Normal  Final   Culture NO GROWTH 6 DAYS  Final   Report Status 07/10/2014 FINAL  Final    RADIOLOGY:  Mr Foot Right W Wo Contrast  07/09/2014   CLINICAL DATA:  Patient stepped on bar wire. Pain and swelling in  the foot. Symptoms for 8 days.  EXAM: MRI OF THE RIGHT FOOT WITHOUT AND WITH CONTRAST  TECHNIQUE: Multiplanar, multisequence MR imaging was performed both before and after administration of intravenous contrast. Osteomyelitis whole foot protocol was performed.  CONTRAST:  20mL MULTIHANCE GADOBENATE DIMEGLUMINE 529 MG/ML IV SOLN  COMPARISON:  07/07/2014  FINDINGS: Osteomyelitis protocol MRI of the foot was obtained, to include the entire foot and ankle. This protocol uses a large field of view to cover the entire foot and ankle, and is suitable for assessing bony structures for osteomyelitis. Due to the large field of view and imaging plane choice, this protocol is less sensitive for assessing small structures such as ligamentous structures of the foot and ankle, compared to a dedicated forefoot or dedicated hindfoot exam.  Subcutaneous edema and enhancement along dorsum of the foot. Abnormal enhancement tracks within the plantar musculature of the foot.  The fourth metatarsal appears shortened. Metal type susceptibility artifact observed in the vicinity of the fourth metatarsal head and fourth MTP joint tracking from the plantar soft tissues to the dorsal soft tissues as shown on images 29 through 39 of series 9. Although I do not see metal foreign bodies in this vicinity on recent radiographs, the possibility of microscopic metal particles related to prior operative intervention is raised -correlate with prior operative history. The becomes difficult to exclude the possibility of gas in the soft tissues in this vicinity although I favor metal artifact given the appearance.  I do not observe a drainable abscess. No significant abnormal osseous edema or definite osseous enhancement to suggest osteomyelitis.  IMPRESSION: 1. Dorsal subcutaneous edema in the foot favoring cellulitis given the associated enhancement. Low-grade enhancement along the plantar muscular structures suggesting myositis, without a drainable  abscess observed. No osseous edema/enhancement to suggest osteomyelitis. 2. Foci of susceptibility artifact extending from the dorsal to the plantar foot in the vicinity of the fourth MTP joint. The fourth metatarsal is chronically shortened and normally I would suspect that the appearance is postoperative due to microscopic metal particles. However, there is no note of any prior operation in the patient's recent consult note or history and physical note. Accordingly the possibility that these foci of low signal intensity might represent gas bubbles in the soft tissues is not entirely discounted. I do not observe gas in the soft tissues on the radiograph from 07/07/2014. Repeat radiography of  the foot to ensure that there are no gas bubbles in this vicinity is recommended.   Electronically Signed   By: Gaylyn Rong M.D.   On: 07/09/2014 09:21        Management plans discussed with the patient, family and they are in agreement.  CODE STATUS:   TOTAL TIME TAKING CARE OF THIS PATIENT: 35 minutes.    Shaune Pollack M.D on 07/10/2014 at 1:50 PM  Between 7am to 6pm - Pager - [phone]  After 6pm go to [email or web address] - password [email or web address] [facility]  [place] [facility]  Office  [phone]  CC: Primary care physician; Rolm Gala, MD

## 2014-07-10 NOTE — Progress Notes (Signed)
Pt being discharged home at this time, wife at bedside, no noted complaints at discharge, discharge and prescriptions reviewed with pt and wife, states understanding

## 2014-07-10 NOTE — Plan of Care (Signed)
Problem: Discharge Progression Outcomes Goal: Other Discharge Outcomes/Goals Outcome: Progressing Plan of care progress to goal for: 1. Pain-no c/o pain this shift 2. Hemodynamically-             -VSS, pt remains afebrile this shift             -IV ABT continues 3. Complications-no evidence of this shift 4. Diet-pt tolerating diet at this time 5. Activity-pt up to BR with standby assist, refuses bed alarm at this time

## 2014-07-10 NOTE — Discharge Instructions (Signed)
Cellulitis Cellulitis is an infection of the skin and the tissue under the skin. The infected area is usually red and tender. This happens most often in the arms and lower legs. HOME CARE   Take your antibiotic medicine as told. Finish the medicine even if you start to feel better.  Keep the infected arm or leg raised (elevated).  Put a warm cloth on the area up to 4 times per day.  Only take medicines as told by your doctor.  Keep all doctor visits as told. GET HELP IF:  You see red streaks on the skin coming from the infected area.  Your red area gets bigger or turns a dark color.  Your bone or joint under the infected area is painful after the skin heals.  Your infection comes back in the same area or different area.  You have a puffy (swollen) bump in the infected area.  You have new symptoms.  You have a fever. GET HELP RIGHT AWAY IF:   You feel very sleepy.  You throw up (vomit) or have watery poop (diarrhea).  You feel sick and have muscle aches and pains. MAKE SURE YOU:   Understand these instructions.  Will watch your condition.  Will get help right away if you are not doing well or get worse. Document Released: 06/08/2007 Document Revised: 05/06/2013 Document Reviewed: 03/07/2011 [facility] Patient Information 2015 [employer] [place]. This information is not intended to replace advice given to you by your health care provider. Make sure you discuss any questions you have with your health care provider.   Low sodium and low fat, ADA diet. Activity as tolerated.

## 2015-01-20 DIAGNOSIS — E538 Deficiency of other specified B group vitamins: Secondary | ICD-10-CM | POA: Diagnosis not present

## 2015-02-04 DIAGNOSIS — I872 Venous insufficiency (chronic) (peripheral): Secondary | ICD-10-CM | POA: Diagnosis not present

## 2015-02-20 DIAGNOSIS — Z79899 Other long term (current) drug therapy: Secondary | ICD-10-CM | POA: Diagnosis not present

## 2015-02-20 DIAGNOSIS — I872 Venous insufficiency (chronic) (peripheral): Secondary | ICD-10-CM | POA: Diagnosis not present

## 2015-02-20 DIAGNOSIS — I890 Lymphedema, not elsewhere classified: Secondary | ICD-10-CM | POA: Diagnosis not present

## 2015-02-20 DIAGNOSIS — I87303 Chronic venous hypertension (idiopathic) without complications of bilateral lower extremity: Secondary | ICD-10-CM | POA: Diagnosis not present

## 2015-02-24 DIAGNOSIS — E538 Deficiency of other specified B group vitamins: Secondary | ICD-10-CM | POA: Diagnosis not present

## 2015-03-05 DIAGNOSIS — I872 Venous insufficiency (chronic) (peripheral): Secondary | ICD-10-CM | POA: Diagnosis not present

## 2015-03-18 DIAGNOSIS — I872 Venous insufficiency (chronic) (peripheral): Secondary | ICD-10-CM | POA: Diagnosis not present

## 2015-03-25 DIAGNOSIS — I87323 Chronic venous hypertension (idiopathic) with inflammation of bilateral lower extremity: Secondary | ICD-10-CM | POA: Diagnosis not present

## 2015-03-25 DIAGNOSIS — L97919 Non-pressure chronic ulcer of unspecified part of right lower leg with unspecified severity: Secondary | ICD-10-CM | POA: Diagnosis not present

## 2015-03-25 DIAGNOSIS — I872 Venous insufficiency (chronic) (peripheral): Secondary | ICD-10-CM | POA: Diagnosis not present

## 2015-03-25 DIAGNOSIS — F17210 Nicotine dependence, cigarettes, uncomplicated: Secondary | ICD-10-CM | POA: Diagnosis not present

## 2015-03-25 DIAGNOSIS — L97929 Non-pressure chronic ulcer of unspecified part of left lower leg with unspecified severity: Secondary | ICD-10-CM | POA: Diagnosis not present

## 2015-03-25 DIAGNOSIS — I890 Lymphedema, not elsewhere classified: Secondary | ICD-10-CM | POA: Diagnosis not present

## 2015-04-01 DIAGNOSIS — I890 Lymphedema, not elsewhere classified: Secondary | ICD-10-CM | POA: Diagnosis not present

## 2015-04-01 DIAGNOSIS — I87303 Chronic venous hypertension (idiopathic) without complications of bilateral lower extremity: Secondary | ICD-10-CM | POA: Diagnosis not present

## 2015-04-01 DIAGNOSIS — I872 Venous insufficiency (chronic) (peripheral): Secondary | ICD-10-CM | POA: Diagnosis not present

## 2015-04-02 DIAGNOSIS — I87303 Chronic venous hypertension (idiopathic) without complications of bilateral lower extremity: Secondary | ICD-10-CM | POA: Diagnosis not present

## 2015-04-02 DIAGNOSIS — Z6841 Body Mass Index (BMI) 40.0 and over, adult: Secondary | ICD-10-CM | POA: Diagnosis not present

## 2015-04-02 DIAGNOSIS — I5032 Chronic diastolic (congestive) heart failure: Secondary | ICD-10-CM | POA: Diagnosis not present

## 2015-04-02 DIAGNOSIS — E782 Mixed hyperlipidemia: Secondary | ICD-10-CM | POA: Diagnosis not present

## 2015-04-02 DIAGNOSIS — I2510 Atherosclerotic heart disease of native coronary artery without angina pectoris: Secondary | ICD-10-CM | POA: Diagnosis not present

## 2015-04-02 DIAGNOSIS — I872 Venous insufficiency (chronic) (peripheral): Secondary | ICD-10-CM | POA: Diagnosis not present

## 2015-04-02 DIAGNOSIS — I10 Essential (primary) hypertension: Secondary | ICD-10-CM | POA: Diagnosis not present

## 2015-04-02 DIAGNOSIS — J432 Centrilobular emphysema: Secondary | ICD-10-CM | POA: Diagnosis not present

## 2015-04-03 DIAGNOSIS — E538 Deficiency of other specified B group vitamins: Secondary | ICD-10-CM | POA: Diagnosis not present

## 2015-04-23 DIAGNOSIS — I872 Venous insufficiency (chronic) (peripheral): Secondary | ICD-10-CM | POA: Diagnosis not present

## 2015-05-05 DIAGNOSIS — E538 Deficiency of other specified B group vitamins: Secondary | ICD-10-CM | POA: Diagnosis not present

## 2015-05-22 DIAGNOSIS — I872 Venous insufficiency (chronic) (peripheral): Secondary | ICD-10-CM | POA: Diagnosis not present

## 2015-06-08 DIAGNOSIS — I83219 Varicose veins of right lower extremity with both ulcer of unspecified site and inflammation: Secondary | ICD-10-CM | POA: Diagnosis not present

## 2015-06-09 DIAGNOSIS — E538 Deficiency of other specified B group vitamins: Secondary | ICD-10-CM | POA: Diagnosis not present

## 2015-06-09 DIAGNOSIS — I83219 Varicose veins of right lower extremity with both ulcer of unspecified site and inflammation: Secondary | ICD-10-CM | POA: Diagnosis not present

## 2015-06-12 DIAGNOSIS — I83219 Varicose veins of right lower extremity with both ulcer of unspecified site and inflammation: Secondary | ICD-10-CM | POA: Diagnosis not present

## 2015-06-22 DIAGNOSIS — I872 Venous insufficiency (chronic) (peripheral): Secondary | ICD-10-CM | POA: Diagnosis not present

## 2015-07-01 DIAGNOSIS — Z79899 Other long term (current) drug therapy: Secondary | ICD-10-CM | POA: Diagnosis not present

## 2015-07-01 DIAGNOSIS — Z7982 Long term (current) use of aspirin: Secondary | ICD-10-CM | POA: Diagnosis not present

## 2015-07-01 DIAGNOSIS — I872 Venous insufficiency (chronic) (peripheral): Secondary | ICD-10-CM | POA: Diagnosis not present

## 2015-07-01 DIAGNOSIS — I890 Lymphedema, not elsewhere classified: Secondary | ICD-10-CM | POA: Diagnosis not present

## 2015-07-01 DIAGNOSIS — F17210 Nicotine dependence, cigarettes, uncomplicated: Secondary | ICD-10-CM | POA: Diagnosis not present

## 2015-07-01 DIAGNOSIS — Z716 Tobacco abuse counseling: Secondary | ICD-10-CM | POA: Diagnosis not present

## 2015-07-01 DIAGNOSIS — I87303 Chronic venous hypertension (idiopathic) without complications of bilateral lower extremity: Secondary | ICD-10-CM | POA: Diagnosis not present

## 2015-07-02 DIAGNOSIS — L97919 Non-pressure chronic ulcer of unspecified part of right lower leg with unspecified severity: Secondary | ICD-10-CM | POA: Diagnosis not present

## 2015-07-27 DIAGNOSIS — L97919 Non-pressure chronic ulcer of unspecified part of right lower leg with unspecified severity: Secondary | ICD-10-CM | POA: Diagnosis not present

## 2015-07-28 DIAGNOSIS — L97919 Non-pressure chronic ulcer of unspecified part of right lower leg with unspecified severity: Secondary | ICD-10-CM | POA: Diagnosis not present

## 2015-08-27 DIAGNOSIS — L97919 Non-pressure chronic ulcer of unspecified part of right lower leg with unspecified severity: Secondary | ICD-10-CM | POA: Diagnosis not present

## 2015-09-10 DIAGNOSIS — F17200 Nicotine dependence, unspecified, uncomplicated: Secondary | ICD-10-CM | POA: Diagnosis not present

## 2015-09-10 DIAGNOSIS — S161XXA Strain of muscle, fascia and tendon at neck level, initial encounter: Secondary | ICD-10-CM | POA: Diagnosis not present

## 2015-09-10 DIAGNOSIS — M542 Cervicalgia: Secondary | ICD-10-CM | POA: Diagnosis not present

## 2015-09-10 DIAGNOSIS — I10 Essential (primary) hypertension: Secondary | ICD-10-CM | POA: Diagnosis not present

## 2015-09-10 DIAGNOSIS — Z79899 Other long term (current) drug therapy: Secondary | ICD-10-CM | POA: Diagnosis not present

## 2015-09-10 DIAGNOSIS — H9209 Otalgia, unspecified ear: Secondary | ICD-10-CM | POA: Diagnosis not present

## 2015-09-10 DIAGNOSIS — X500XXA Overexertion from strenuous movement or load, initial encounter: Secondary | ICD-10-CM | POA: Diagnosis not present

## 2015-09-10 DIAGNOSIS — Z125 Encounter for screening for malignant neoplasm of prostate: Secondary | ICD-10-CM | POA: Diagnosis not present

## 2015-09-10 DIAGNOSIS — R739 Hyperglycemia, unspecified: Secondary | ICD-10-CM | POA: Diagnosis not present

## 2015-09-25 DIAGNOSIS — M5030 Other cervical disc degeneration, unspecified cervical region: Secondary | ICD-10-CM | POA: Diagnosis not present

## 2015-09-25 DIAGNOSIS — E669 Obesity, unspecified: Secondary | ICD-10-CM | POA: Diagnosis not present

## 2015-09-25 DIAGNOSIS — M542 Cervicalgia: Secondary | ICD-10-CM | POA: Diagnosis not present

## 2015-09-25 DIAGNOSIS — E1169 Type 2 diabetes mellitus with other specified complication: Secondary | ICD-10-CM | POA: Diagnosis not present

## 2015-09-25 DIAGNOSIS — M47812 Spondylosis without myelopathy or radiculopathy, cervical region: Secondary | ICD-10-CM | POA: Diagnosis not present

## 2015-09-29 DIAGNOSIS — Z23 Encounter for immunization: Secondary | ICD-10-CM | POA: Diagnosis not present

## 2015-09-30 DIAGNOSIS — I2510 Atherosclerotic heart disease of native coronary artery without angina pectoris: Secondary | ICD-10-CM | POA: Diagnosis not present

## 2015-09-30 DIAGNOSIS — E782 Mixed hyperlipidemia: Secondary | ICD-10-CM | POA: Diagnosis not present

## 2015-09-30 DIAGNOSIS — I10 Essential (primary) hypertension: Secondary | ICD-10-CM | POA: Diagnosis not present

## 2015-09-30 DIAGNOSIS — E669 Obesity, unspecified: Secondary | ICD-10-CM | POA: Diagnosis not present

## 2015-09-30 DIAGNOSIS — I890 Lymphedema, not elsewhere classified: Secondary | ICD-10-CM | POA: Diagnosis not present

## 2015-09-30 DIAGNOSIS — I5032 Chronic diastolic (congestive) heart failure: Secondary | ICD-10-CM | POA: Diagnosis not present

## 2015-09-30 DIAGNOSIS — E1169 Type 2 diabetes mellitus with other specified complication: Secondary | ICD-10-CM | POA: Diagnosis not present

## 2015-10-27 ENCOUNTER — Encounter (INDEPENDENT_AMBULATORY_CARE_PROVIDER_SITE_OTHER): Payer: PPO | Admitting: Vascular Surgery

## 2015-10-27 ENCOUNTER — Encounter (INDEPENDENT_AMBULATORY_CARE_PROVIDER_SITE_OTHER): Payer: Self-pay

## 2015-10-27 ENCOUNTER — Encounter (INDEPENDENT_AMBULATORY_CARE_PROVIDER_SITE_OTHER): Payer: Medicare Other | Admitting: Vascular Surgery

## 2015-10-28 ENCOUNTER — Ambulatory Visit (INDEPENDENT_AMBULATORY_CARE_PROVIDER_SITE_OTHER): Payer: PPO | Admitting: Vascular Surgery

## 2015-10-28 ENCOUNTER — Encounter (INDEPENDENT_AMBULATORY_CARE_PROVIDER_SITE_OTHER): Payer: Self-pay | Admitting: Vascular Surgery

## 2015-10-28 VITALS — BP 145/72 | HR 72 | Resp 18 | Ht 64.0 in | Wt 289.6 lb

## 2015-10-28 DIAGNOSIS — I509 Heart failure, unspecified: Secondary | ICD-10-CM

## 2015-10-28 DIAGNOSIS — F17200 Nicotine dependence, unspecified, uncomplicated: Secondary | ICD-10-CM | POA: Diagnosis not present

## 2015-10-28 DIAGNOSIS — I10 Essential (primary) hypertension: Secondary | ICD-10-CM | POA: Diagnosis not present

## 2015-10-28 DIAGNOSIS — J449 Chronic obstructive pulmonary disease, unspecified: Secondary | ICD-10-CM | POA: Diagnosis not present

## 2015-10-28 DIAGNOSIS — I739 Peripheral vascular disease, unspecified: Secondary | ICD-10-CM | POA: Diagnosis not present

## 2015-10-28 DIAGNOSIS — I890 Lymphedema, not elsewhere classified: Secondary | ICD-10-CM | POA: Diagnosis not present

## 2015-10-28 NOTE — Progress Notes (Signed)
Subjective:    Patient ID: Fernando [name], male    DOB: Oct 31, 1955, 60 y.o.   MRN: [id number] Chief Complaint  Patient presents with  . New Patient (Initial Visit)   Patient presents as a new patient referred by Dr. Rushie GoltzFaith. Patient and wife (who was present at exam) looking for new vascular surgeon and wound care doctor. Patient endorses a history of being diagnosed with "Lymphedema" 3.5 years ago. Since his diagnosis the patient has been dealing with intermittent exacerbation, weeping and ulcer formation on his bilateral lower extremities. States ulcers are usually located on the calves. He has been treated with unna wraps and compression. Today he presents wearing farrow wraps. He has a active ulceration on his right calf and a "almost healed" ulceration on his left. No unna boot therapy as the co-pays and travel time from [place], [place] are too much for the patient. Treating with local wound care (saline) and compression with farrow wraps. Was [facility] vascular and wound care needs. Patient has a history of CHF and COPD. Patient does not remember if he ever underwent a venous duplex to rule out reflux in the past. Does not have a lymphedema pump.     Review of Systems  Constitutional: Negative.   HENT: Negative.   Eyes: Negative.   Respiratory:       COPD  Cardiovascular: Positive for leg swelling (Bilateral).       CHF  Gastrointestinal: Negative.   Endocrine:       Pre-Diabetic  Genitourinary: Negative.   Musculoskeletal: Negative.   Skin: Positive for wound (Bilateral lower extremity ulcerations).  Allergic/Immunologic: Negative.   Neurological: Negative.   Hematological: Negative.   Psychiatric/Behavioral: Negative.       Objective:   Physical Exam  Constitutional: He is oriented to person, place, and time. He appears well-developed and well-nourished.  Morbidly Obese  HENT:  Head: Normocephalic and atraumatic.  Eyes: Conjunctivae and EOM are normal.  Pupils are equal, round, and reactive to light.  Neck: Normal range of motion.  Cardiovascular: Normal rate and regular rhythm.   Pulses:      Radial pulses are 2+ on the right side, and 2+ on the left side.  Hard to assess pedal pulses due to edema. No gangrenous changes or ulcers to toes.   Pulmonary/Chest: Effort normal. He has wheezes.  Abdominal: Soft. Bowel sounds are normal.  Musculoskeletal: Normal range of motion. He exhibits edema (Moderate 2+ pitting edema Bilaterally).  Neurological: He is alert and oriented to person, place, and time.  Skin:  Right Lower Extremity: Large calf non-infected calf ulceration noted. Left Lower Extremity: healing small calf ulceration noted. Severe venous dermatitis noted. No cellulitis.   Psychiatric: He has a normal mood and affect. His behavior is normal. Judgment and thought content normal.   BP (!) 145/72   Pulse 72   Resp 18   Ht 5\' 4"  (1.626 m)   Wt 289 lb 9.6 oz (131.4 kg)   BMI 49.71 kg/m   Past Medical History:  Diagnosis Date  . CAD (coronary artery disease)   . CHF (congestive heart failure) (HCC)   . COPD (chronic obstructive pulmonary disease) (HCC)   . Diabetes mellitus without complication (HCC)   . Hyperlipidemia   . Hypertension   . Lymphedema   . Sleep apnea   . Venous insufficiency    Social History   Social History  . Marital status: Married    Spouse name: N/A  .  Number of children: N/A  . Years of education: N/A   Occupational History  . Not on file.   Social History Main Topics  . Smoking status: Current Every Day Smoker    Packs/day: 2.00    Types: Cigarettes  . Smokeless tobacco: Current User  . Alcohol use No  . Drug use: No  . Sexual activity: Not on file   Other Topics Concern  . Not on file   Social History Narrative  . No narrative on file   Past Surgical History:  Procedure Laterality Date  . ADENOIDECTOMY    . ANGIOPLASTY    . APPENDECTOMY    . CARDIAC CATHETERIZATION    .  HERNIA REPAIR    . TONSILLECTOMY     Family History  Problem Relation Age of Onset  . Cancer Father    Allergies  Allergen Reactions  . Augmentin [Amoxicillin-Pot Clavulanate] Hives  . Codeine Hives  . Other Rash    methialate      Assessment & Plan:  Patient presents as a new patient referred by Dr. Rushie Goltz. Patient and wife (who was present at exam) looking for new vascular surgeon and wound care doctor. Patient endorses a history of being diagnosed with "Lymphedema" 3.5 years ago. Since his diagnosis the patient has been dealing with intermittent exacerbation, weeping and ulcer formation on his bilateral lower extremities. States ulcers are usually located on the calves. He has been treated with unna wraps and compression. Today he presents wearing farrow wraps. He has a active ulceration on his right calf and a "almost healed" ulceration on his left. No unna boot therapy as the co-pays and travel time from [place], [place] are too much for the patient. Treating with local wound care (saline) and compression with farrow wraps. Was previous seen at [facility] for his vascular and wound care needs. Patient has a history of CHF and COPD. Patient does not remember if he ever underwent a venous duplex to rule out reflux in the past. Does not have a lymphedema pump.   1. Lymphedema With Ulcer Formation on Bilaterally Lower Extremities - New Patient and wife refusing unna boots due to lack of insurance coverage / price and travel time. Prescibed silvadene to area BID. Patient to continue to wear farrow wraps daily and elevate legs. Wound Center consult for wound management. Will bring patient back for bilateral lower extremity venous duplex to assess anatomy / reflux. Based on duplex will possibly pursue laser ablation or lymphedema pump.   2. PAD (peripheral artery disease) (HCC) - New Patient will multiple risk factor for PAD, non-palpable pedal pulses. Edema contributing to this but will bring patient back  for ABI to assess if any PAD is present contributing to ulcer formation.   3. Essential hypertension - Stable Encouraged good control as its slows the progression of atherosclerotic disease  4. Congestive heart failure, unspecified congestive heart failure chronicity, unspecified congestive heart failure type (HCC) - Stable Also a contributing factor to patient bilateral lower extremity swelling.   5. Tobacco dependence - Stable I have discussed with the patient the role of tobacco in the pathogenesis of atherosclerosis and its effect on the progression of the disease, impact on the durability of interventions and its limitations on the formation of collateral pathways. I have recommended absolute tobacco cessation. I have discussed various options available for assistance with tobacco cessation including over the counter methods (Nicotine gum, patch and lozenges). We also discussed prescription options (Chantix, Nicotine Inhaler / Nasal  Spray). The patient is not interested in pursuing any prescription tobacco cessation options at this time. The patient voices their understanding.   Current Outpatient Prescriptions on File Prior to Visit  Medication Sig Dispense Refill  . acetaminophen (TYLENOL) 500 MG tablet Take 1,500 mg by mouth every 6 (six) hours as needed. For  Pain.    Marland Kitchen aspirin EC 81 MG tablet Take 81 mg by mouth daily.    . carvedilol (COREG) 3.125 MG tablet Take 3.125 mg by mouth 2 (two) times daily.    . cyanocobalamin (,VITAMIN B-12,) 1000 MCG/ML injection Inject 1,000 mcg into the muscle every 30 (thirty) days.    Marland Kitchen EPINEPHrine (EPIPEN 2-PAK) 0.3 mg/0.3 mL IJ SOAJ injection Inject 0.3 mg as directed daily as needed. For allergic reaction.    Marland Kitchen losartan (COZAAR) 50 MG tablet Take 50 mg by mouth daily.    . meloxicam (MOBIC) 15 MG tablet Take 15 mg by mouth daily as needed. With food for pain.    Marland Kitchen triamcinolone cream (KENALOG) 0.1 % Apply 1 application topically daily as needed (for  leg rash.).     Marland Kitchen ciprofloxacin (CIPRO) 500 MG tablet Take 1 tablet (500 mg total) by mouth 2 (two) times daily. (Patient not taking: Reported on 10/28/2015) 20 tablet 0  . clindamycin (CLEOCIN) 300 MG capsule Take 1 capsule (300 mg total) by mouth 3 (three) times daily. (Patient not taking: Reported on 10/28/2015) 30 capsule 0  . digoxin (LANOXIN) 0.125 MG tablet Take 125 mcg by mouth daily.    . potassium chloride (MICRO-K) 10 MEQ CR capsule Take 10 mEq by mouth daily.    Marland Kitchen tiotropium (SPIRIVA HANDIHALER) 18 MCG inhalation capsule Place 18 mcg into inhaler and inhale daily.     No current facility-administered medications on file prior to visit.     There are no Patient Instructions on file for this visit. Return in about 1 week (around 11/04/2015) for ABI and Bilateral Lower Extremity Venous Duplex .   Anahita Cua A Semaj Coburn, PA-C

## 2015-10-29 ENCOUNTER — Encounter (INDEPENDENT_AMBULATORY_CARE_PROVIDER_SITE_OTHER): Payer: Medicare Other | Admitting: Vascular Surgery

## 2015-10-29 ENCOUNTER — Encounter (INDEPENDENT_AMBULATORY_CARE_PROVIDER_SITE_OTHER): Payer: Self-pay

## 2015-10-30 DIAGNOSIS — E538 Deficiency of other specified B group vitamins: Secondary | ICD-10-CM | POA: Diagnosis not present

## 2015-11-02 ENCOUNTER — Encounter: Payer: PPO | Attending: Surgery | Admitting: Surgery

## 2015-11-02 DIAGNOSIS — I739 Peripheral vascular disease, unspecified: Secondary | ICD-10-CM | POA: Diagnosis not present

## 2015-11-02 DIAGNOSIS — Z6841 Body Mass Index (BMI) 40.0 and over, adult: Secondary | ICD-10-CM | POA: Diagnosis not present

## 2015-11-02 DIAGNOSIS — F17210 Nicotine dependence, cigarettes, uncomplicated: Secondary | ICD-10-CM | POA: Insufficient documentation

## 2015-11-02 DIAGNOSIS — J449 Chronic obstructive pulmonary disease, unspecified: Secondary | ICD-10-CM | POA: Diagnosis not present

## 2015-11-02 DIAGNOSIS — I890 Lymphedema, not elsewhere classified: Secondary | ICD-10-CM | POA: Insufficient documentation

## 2015-11-02 DIAGNOSIS — I509 Heart failure, unspecified: Secondary | ICD-10-CM | POA: Diagnosis not present

## 2015-11-02 DIAGNOSIS — L97222 Non-pressure chronic ulcer of left calf with fat layer exposed: Secondary | ICD-10-CM | POA: Insufficient documentation

## 2015-11-02 DIAGNOSIS — I110 Hypertensive heart disease with heart failure: Secondary | ICD-10-CM | POA: Insufficient documentation

## 2015-11-02 DIAGNOSIS — E785 Hyperlipidemia, unspecified: Secondary | ICD-10-CM | POA: Insufficient documentation

## 2015-11-02 DIAGNOSIS — L97212 Non-pressure chronic ulcer of right calf with fat layer exposed: Secondary | ICD-10-CM | POA: Diagnosis not present

## 2015-11-02 DIAGNOSIS — E11622 Type 2 diabetes mellitus with other skin ulcer: Secondary | ICD-10-CM | POA: Insufficient documentation

## 2015-11-02 DIAGNOSIS — L97821 Non-pressure chronic ulcer of other part of left lower leg limited to breakdown of skin: Secondary | ICD-10-CM | POA: Diagnosis not present

## 2015-11-02 DIAGNOSIS — I2510 Atherosclerotic heart disease of native coronary artery without angina pectoris: Secondary | ICD-10-CM | POA: Diagnosis not present

## 2015-11-02 DIAGNOSIS — G4733 Obstructive sleep apnea (adult) (pediatric): Secondary | ICD-10-CM | POA: Insufficient documentation

## 2015-11-02 DIAGNOSIS — Z880 Allergy status to penicillin: Secondary | ICD-10-CM | POA: Diagnosis not present

## 2015-11-02 DIAGNOSIS — L97812 Non-pressure chronic ulcer of other part of right lower leg with fat layer exposed: Secondary | ICD-10-CM | POA: Diagnosis not present

## 2015-11-03 NOTE — Progress Notes (Signed)
RANDEN, KAUTH ([id number]) Visit Report for 11/02/2015 Chief Complaint Document Details Patient Name: Fernando [name], Fernando [name] 11/02/2015 8:00 Date of Service: AM Medical Record [id number] Number: Patient Account Number: [id number] Date of Birth/Sex: 06/22/1955 (60 y.o. Male) Treating RN: Curtis Sites Primary Care Physician: Rolm Gala Other Clinician: Geri Seminole, Treating Referring Physician: Roe Coombs Physician/Extender: Tania Ade in Treatment: 0 Information Obtained from: Patient Chief Complaint Patient presents to the wound care center for a consult due non healing wound Both lower extremities with swelling and has had this for at least 4 years Electronic Signature(s) Signed: 11/02/2015 9:24:29 AM By: Evlyn Kanner MD, FACS Entered By: Evlyn Kanner on 11/02/2015 09:24:28 Fernando Mannan ([id number]) -------------------------------------------------------------------------------- HPI Details Patient Name: Fernando, [name] 11/02/2015 8:00 Date of Service: AM Medical Record [id number] Number: Patient Account Number: [id number] Date of Birth/Sex: [birth year] (60 y.o. Male) Treating RN: Curtis Sites Primary Care Physician: Rolm Gala Other Clinician: Geri Seminole, Treating Referring Physician: Roe Coombs Physician/Extender: Weeks in Treatment: 0 History of Present Illness Location: ulcers both lower extremities right worse than left Quality: Patient reports experiencing a dull pain to affected area(s). Severity: Patient states wound are getting worse. Duration: Patient has had the wound for > 3 years prior to seeking treatment at the wound center Timing: Pain in wound is Intermittent (comes and goes Context: The wound appeared gradually over time Modifying Factors: Consults to this date include:wound center at [facility] and cardiology Associated Signs and Symptoms: Patient reports having increase swelling. HPI Description: 60 year old gentleman who has  diabetes mellitus, been referred to was for use with lymphedema and peripheral vascular disease. Stand he's had a vascular workup ordered and has a scale opinion done recently. He is a smoker and smokes about 2 packs of cigarettes a day. past medical history significant for coronary artery disease, CHF, COPD, hyperlipidemia, hypertension, lower extremity edema, obesity, nicotine addiction, status post chronically occluded right coronary artery, history of obstructive sleep apnea treated with a CPAP machine. he is status post hernia repair, tonsillectomy, appendectomy and angioplasty. Note is Made that he has been treated at the wound center for chronic lymphedema with ulceration which improves with Unna's but recurs when he does not have them. hemoglobin A1c was 6.6% Electronic Signature(s) Signed: 11/02/2015 9:31:42 AM By: Evlyn Kanner MD, FACS Previous Signature: 11/02/2015 9:26:09 AM Version By: Evlyn Kanner MD, FACS Previous Signature: 11/02/2015 9:03:49 AM Version By: Evlyn Kanner MD, FACS Previous Signature: 11/02/2015 8:45:32 AM Version By: Evlyn Kanner MD, FACS Entered By: Evlyn Kanner on 11/02/2015 09:31:42 Fernando Mannan ([id number]) -------------------------------------------------------------------------------- Physical Exam Details Patient Name: Fernando, [name] 11/02/2015 8:00 Date of Service: AM Medical Record [id number] Number: Patient Account Number: [id number] Date of Birth/Sex: 14-Jun-1955 (60 y.o. Male) Treating RN: Curtis Sites Primary Care Physician: Rolm Gala Other Clinician: Geri Seminole, Treating Referring Physician: Roe Coombs Physician/Extender: Weeks in Treatment: 0 Constitutional . Pulse regular. Respirations normal and unlabored. Afebrile. . Eyes Nonicteric. Reactive to light. Ears, Nose, Mouth, and Throat Lips, teeth, and gums WNL.Marland Kitchen Moist mucosa without lesions. Neck supple and nontender. No palpable supraclavicular or cervical  adenopathy. Normal sized without goiter. Respiratory WNL. No retractions.. Breath sounds WNL, No rubs, rales, rhonchi, or wheeze.. Cardiovascular . Pedal Pulses WNL. ABIs were noncompressible. stage II lymphedema bilaterally. Gastrointestinal (GI) Abdomen without masses or tenderness.. No liver or spleen enlargement or tenderness.. Lymphatic No adneopathy. No adenopathy. No adenopathy. Musculoskeletal Adexa without tenderness or enlargement.. Digits and nails w/o clubbing, cyanosis, infection, petechiae, ischemia, or inflammatory conditions.. Integumentary (Hair, Skin) No suspicious lesions. No crepitus or fluctuance.  No peri-wound warmth or erythema. No masses.Marland Kitchen. Psychiatric Judgement and insight Intact.. No evidence of depression, anxiety, or agitation.. Notes stage II lymphedema bilaterally with an ulcerated area right worse than left both lateral and anterior calves. No sharp Debridement was done today Electronic Signature(s) Signed: 11/02/2015 9:27:44 AM By: Evlyn KannerBritto, Janijah Symons MD, FACS Entered By: Evlyn KannerBritto, Krystan Northrop on 11/02/2015 09:27:43 Fernando [name], Philipe ([id number]) -------------------------------------------------------------------------------- Physician Orders Details Patient Name: Fernando [name] 11/02/2015 8:00 Date of Service: AM Medical Record [id number] Number: Patient Account Number: [id number] Date of Birth/Sex: April 14, 1955 104(60 y.o. Male) Treating RN: Curtis Sitesorthy, Joanna Primary Care Physician: Rolm GalaGrandis, Heidi Other Clinician: Geri SeminoleSTEGMAYER, Treating Referring Physician: Roe CoombsBritto, Nakari Bracknell KIMBERLY Physician/Extender: Tania AdeWeeks in Treatment: 0 Verbal / Phone Orders: Yes Clinician: Curtis Sitesorthy, Joanna Read Back and Verified: Yes Diagnosis Coding Wound Cleansing Wound #1 Right,Anterior Lower Leg o May shower with protection. Wound #2 Left,Lateral Lower Leg o May shower with protection. Anesthetic Wound #1 Right,Anterior Lower Leg o Topical Lidocaine 4% cream applied to wound bed prior  to debridement Wound #2 Left,Lateral Lower Leg o Topical Lidocaine 4% cream applied to wound bed prior to debridement Primary Wound Dressing Wound #1 Right,Anterior Lower Leg o Aquacel Ag Wound #2 Left,Lateral Lower Leg o Aquacel Ag Secondary Dressing Wound #1 Right,Anterior Lower Leg o ABD pad o XtraSorb Wound #2 Left,Lateral Lower Leg o ABD pad o XtraSorb Dressing Change Frequency Wound #1 Right,Anterior Lower Leg o Change dressing every week Fernando [name], Lanson ([id number]) Wound #2 Left,Lateral Lower Leg o Change dressing every week Follow-up Appointments Wound #1 Right,Anterior Lower Leg o Return Appointment in 1 week. o Nurse Visit as needed Wound #2 Left,Lateral Lower Leg o Return Appointment in 1 week. o Nurse Visit as needed Edema Control Wound #1 Right,Anterior Lower Leg o 3 Layer Compression System - Bilateral Wound #2 Left,Lateral Lower Leg o 3 Layer Compression System - Bilateral Additional Orders / Instructions Wound #1 Right,Anterior Lower Leg o Increase protein intake. Wound #2 Left,Lateral Lower Leg o Increase protein intake. Electronic Signature(s) Signed: 11/02/2015 3:58:21 PM By: Evlyn KannerBritto, Santana Gosdin MD, FACS Signed: 11/02/2015 4:49:50 PM By: Curtis Sitesorthy, Joanna Entered By: Curtis Sitesorthy, Joanna on 11/02/2015 09:27:34 Fernando [name], Desi ([id number]) -------------------------------------------------------------------------------- Problem List Details Patient Name: Fernando [name], Abir 11/02/2015 8:00 Date of Service: AM Medical Record [id number] Number: Patient Account Number: [id number] Date of Birth/Sex: April 14, 1955 [age] y.o. Male) Treating RN: Curtis Sitesorthy, Joanna Primary Care Physician: Rolm GalaGrandis, Heidi Other Clinician: Geri SeminoleSTEGMAYER, Treating Referring Physician: Roe CoombsBritto, Carmon Brigandi KIMBERLY Physician/Extender: Tania AdeWeeks in Treatment: 0 Active Problems ICD-10 Encounter Code Description Active Date Diagnosis E11.622 Type 2 diabetes mellitus with other  skin ulcer 11/02/2015 Yes I89.0 Lymphedema, not elsewhere classified 11/02/2015 Yes E66.01 Morbid (severe) obesity due to excess calories 11/02/2015 Yes L97.222 Non-pressure chronic ulcer of left calf with fat layer 11/02/2015 Yes exposed L97.212 Non-pressure chronic ulcer of right calf with fat layer 11/02/2015 Yes exposed Inactive Problems Resolved Problems Electronic Signature(s) Signed: 11/02/2015 9:24:01 AM By: Evlyn KannerBritto, Smokey Melott MD, FACS Entered By: Evlyn KannerBritto, Murad Staples on 11/02/2015 09:24:01 Fernando [name], Jacub ([id number]) -------------------------------------------------------------------------------- Progress Note Details Patient Name: Fernando [name], Reynol 11/02/2015 8:00 Date of Service: AM Medical Record [id number] Number: Patient Account Number: [id number] Date of Birth/Sex: April 14, 1955 [age] y.o. Male) Treating RN: Curtis Sitesorthy, Joanna Primary Care Physician: Rolm GalaGrandis, Heidi Other Clinician: Geri SeminoleSTEGMAYER, Treating Referring Physician: Roe CoombsBritto, Jolana Runkles KIMBERLY Physician/Extender: Weeks in Treatment: 0 Subjective Chief Complaint Information obtained from Patient Patient presents to the wound care center for a consult due non healing wound Both lower extremities with swelling and has had this for at least 4 years History of Present Illness (HPI) The  following HPI elements were documented for the patient's wound: Location: ulcers both lower extremities right worse than left Quality: Patient reports experiencing a dull pain to affected area(s). Severity: Patient states wound are getting worse. Duration: Patient has had the wound for > 3 years prior to seeking treatment at the wound center Timing: Pain in wound is Intermittent (comes and goes Context: The wound appeared gradually over time Modifying Factors: [facility] to [facility] date [facility] Associated Signs and Symptoms: Patient reports having increase swelling. 60 year old gentleman who has diabetes mellitus, been  referred to was for use with lymphedema and peripheral vascular disease. Stand he's had a vascular workup ordered and has a scale opinion done recently. He is a smoker and smokes about 2 packs of cigarettes a day. past medical history significant for coronary artery disease, CHF, COPD, hyperlipidemia, hypertension, lower extremity edema, obesity, nicotine addiction, status post chronically occluded right coronary artery, history of obstructive sleep apnea treated with a CPAP machine. he is status post hernia repair, tonsillectomy, appendectomy and angioplasty. Note is Made that he has been treated at the wound center for chronic lymphedema with ulceration which improves with Unna's but recurs when he does not have them. hemoglobin A1c was 6.6% Wound History Patient presents with 2 open wounds that have been present for approximately 2 1/2 years. Patient has been treating wounds in the following manner: unna boot. The wounds have been healed in the past but have re-opened. Laboratory tests have not been performed in the last month. Patient reportedly has not tested positive for an antibiotic resistant organism. Patient reportedly has not tested positive for osteomyelitis. Patient reportedly has not had testing performed to evaluate circulation in the legs. Patient experiences the following problems associated with their wounds: swelling. Fernando [name], [name] ([id number]) Patient History Information obtained from Patient. Allergies Augmentin, codeine, methialate Social History Current every day smoker, Marital Status - Married, Alcohol Use - Never, Drug Use - No History, Caffeine Use - Daily. Medical History Hematologic/Lymphatic Patient has history of Lymphedema Respiratory Patient has history of Chronic Obstructive Pulmonary Disease (COPD), Sleep Apnea - CPAP Cardiovascular Patient has history of Congestive Heart Failure, Hypertension, Peripheral Arterial Disease Endocrine Patient has  history of Type II Diabetes Oncologic Denies history of Received Chemotherapy, Received Radiation Medical And Surgical History Notes Cardiovascular venous insufficiency Review of Systems (ROS) Constitutional Symptoms (General Health) The patient has no complaints or symptoms. Eyes The patient has no complaints or symptoms. Ear/Nose/Mouth/Throat The patient has no complaints or symptoms. Hematologic/Lymphatic The patient has no complaints or symptoms. Respiratory The patient has no complaints or symptoms. Cardiovascular Complains or has symptoms of LE edema. Gastrointestinal The patient has no complaints or symptoms. Endocrine The patient has no complaints or symptoms. Genitourinary The patient has no complaints or symptoms. Immunological The patient has no complaints or symptoms. Integumentary (Skin) The patient has no complaints or symptoms. Musculoskeletal Fernando [name], Cleatis ([id number]) The patient has no complaints or symptoms. Neurologic The patient has no complaints or symptoms. Oncologic The patient has no complaints or symptoms. Psychiatric The patient has no complaints or symptoms. Objective Constitutional Pulse regular. Respirations normal and unlabored. Afebrile. Vitals Time Taken: 8:19 AM, Height: 64 in, Source: Stated, Weight: 291 lbs, Source: Stated, BMI: 49.9, Temperature: 98.4 F, Pulse: 75 bpm, Respiratory Rate: 22 breaths/min, Blood Pressure: 118/60 mmHg. Eyes Nonicteric. Reactive to light. Ears, Nose, Mouth, and Throat Lips, teeth, and gums WNL.Marland Kitchen. Moist mucosa without lesions. Neck supple and nontender. No palpable supraclavicular or cervical adenopathy.  Normal sized without goiter. Respiratory WNL. No retractions.. Breath sounds WNL, No rubs, rales, rhonchi, or wheeze.. Cardiovascular Pedal Pulses WNL. ABIs were noncompressible. stage II lymphedema bilaterally. Gastrointestinal (GI) Abdomen without masses or tenderness.. No liver or spleen  enlargement or tenderness.. Lymphatic No adneopathy. No adenopathy. No adenopathy. Musculoskeletal Adexa without tenderness or enlargement.. Digits and nails w/o clubbing, cyanosis, infection, petechiae, ischemia, or inflammatory conditions.Marland Kitchen Psychiatric Judgement and insight Intact.. No evidence of depression, anxiety, or agitation.Marland Kitchen SHONE, LEVENTHAL ([id number]) General Notes: stage II lymphedema bilaterally with an ulcerated area right worse than left both lateral and anterior calves. No sharp Debridement was done today Integumentary (Hair, Skin) No suspicious lesions. No crepitus or fluctuance. No peri-wound warmth or erythema. No masses.. Wound #1 status is Open. Original cause of wound was Gradually Appeared. The wound is located on the Right,Anterior Lower Leg. The wound measures 5.8cm length x 7.9cm width x 0.2cm depth; 35.987cm^2 area and 7.197cm^3 volume. There is fat exposed. There is no tunneling or undermining noted. There is a large amount of serous drainage noted. The wound margin is flat and intact. There is small (1-33%) pink granulation within the wound bed. There is a large (67-100%) amount of necrotic tissue within the wound bed including Adherent Slough. The periwound skin appearance exhibited: Maceration, Moist. The periwound skin appearance did not exhibit: Callus, Crepitus, Excoriation, Fluctuance, Friable, Induration, Localized Edema, Rash, Scarring, Dry/Scaly, Atrophie Blanche, Cyanosis, Ecchymosis, Hemosiderin Staining, Mottled, Pallor, Rubor, Erythema. The periwound has tenderness on palpation. Wound #2 status is Open. Original cause of wound was Gradually Appeared. The wound is located on the Left,Lateral Lower Leg. The wound measures 5cm length x 6.5cm width x 0.1cm depth; 25.525cm^2 area and 2.553cm^3 volume. The wound is limited to skin breakdown. There is no tunneling or undermining noted. There is a large amount of serous drainage noted. The wound margin is  flat and intact. There is small (1-33%) pink granulation within the wound bed. There is a large (67-100%) amount of necrotic tissue within the wound bed including Adherent Slough. The periwound skin appearance did not exhibit: Callus, Crepitus, Excoriation, Fluctuance, Friable, Induration, Localized Edema, Rash, Scarring, Dry/Scaly, Maceration, Moist, Atrophie Blanche, Cyanosis, Ecchymosis, Hemosiderin Staining, Mottled, Pallor, Rubor, Erythema. The periwound has tenderness on palpation. Assessment Active Problems ICD-10 E11.622 - Type 2 diabetes mellitus with other skin ulcer I89.0 - Lymphedema, not elsewhere classified E66.01 - Morbid (severe) obesity due to excess calories L97.222 - Non-pressure chronic ulcer of left calf with fat layer exposed L97.212 - Non-pressure chronic ulcer of right calf with fat layer exposed 60 year old gentleman who is morbidly obese, diabetic and has significant CHF and COPD he can barely breathe while he is sitting and has significant dyspnea. He smokes about 2 packets of cigarettes a day. I have had a long discussion with the patient and his wife and have recommended: AVYON, HERENDEEN ([id number]) 1. silver alginate and a Profore 3 layer compression. 2. Elevation and exercise 3. completly giving up smoking and I have spent 3 minutes discussing this in great detail with him 4. arterial and venous duplex studies have already been scheduled 5. Control of his diabetes mellitus and CHF 6. regular visits to the wound center Procedures Wound #1 Wound #1 is a Lymphedema located on the Right,Anterior Lower Leg . There was a Three Layer Compression Therapy Procedure by Curtis Sites, RN. Post procedure Diagnosis Wound #1: Same as Pre-Procedure Wound #2 Wound #2 is a Lymphedema located on the Left,Lateral Lower Leg . There was a Three  Layer Compression Therapy Procedure with a pre-treatment ABI of 1.3 by Curtis Sites, RN. Post procedure Diagnosis Wound #2: Same  as Pre-Procedure Plan Wound Cleansing: Wound #1 Right,Anterior Lower Leg: May shower with protection. Wound #2 Left,Lateral Lower Leg: May shower with protection. Anesthetic: Wound #1 Right,Anterior Lower Leg: Topical Lidocaine 4% cream applied to wound bed prior to debridement Wound #2 Left,Lateral Lower Leg: Topical Lidocaine 4% cream applied to wound bed prior to debridement Primary Wound Dressing: Wound #1 Right,Anterior Lower Leg: Aquacel Ag Wound #2 Left,Lateral Lower Leg: Aquacel Ag Secondary Dressing: Wound #1 Right,Anterior Lower Leg: ABD pad XtraSorb Wound #2 Left,Lateral Lower Leg: ABD pad XtraSorb Dressing Change FrequencyJORIAN, WILLHOITE ([id number]) Wound #1 Right,Anterior Lower Leg: Change dressing every week Wound #2 Left,Lateral Lower Leg: Change dressing every week Follow-up Appointments: Wound #1 Right,Anterior Lower Leg: Return Appointment in 1 week. Nurse Visit as needed Wound #2 Left,Lateral Lower Leg: Return Appointment in 1 week. Nurse Visit as needed Edema Control: Wound #1 Right,Anterior Lower Leg: 3 Layer Compression System - Bilateral Wound #2 Left,Lateral Lower Leg: 3 Layer Compression System - Bilateral Additional Orders / Instructions: Wound #1 Right,Anterior Lower Leg: Increase protein intake. Wound #2 Left,Lateral Lower Leg: Increase protein intake. 60 year old gentleman who is morbidly obese, diabetic and has significant CHF and COPD he can barely breathe while he is sitting and has significant dyspnea. He smokes about 2 packets of cigarettes a day. I have had a long discussion with the patient and his wife and have recommended: 1. silver alginate and a Profore 3 layer compression. 2. Elevation and exercise 3. completly giving up smoking and I have spent 3 minutes discussing this in great detail with him 4. arterial and venous duplex studies have already been scheduled 5. Control of his diabetes mellitus and CHF 6. regular  visits to the wound center Electronic Signature(s) Signed: 11/02/2015 9:32:00 AM By: Evlyn Kanner MD, FACS Previous Signature: 11/02/2015 9:31:03 AM Version By: Evlyn Kanner MD, FACS Entered By: Evlyn Kanner on 11/02/2015 09:32:00 Fernando Mannan ([id number]) -------------------------------------------------------------------------------- ROS/PFSH Details Patient Name: SIMUEL, STEBNER 11/02/2015 8:00 Date of Service: AM Medical Record [id number] Number: Patient Account Number: [id number] Date of Birth/Sex: 1955-08-17 (60 y.o. Male) Treating RN: Curtis Sites Primary Care Physician: Rolm Gala Other Clinician: Geri Seminole, Treating Referring Physician: Roe Coombs Physician/Extender: Weeks in Treatment: 0 Information Obtained From Patient Wound History Do you currently have one or more open woundso Yes How many open wounds do you currently haveo 2 Approximately how long have you had your woundso 2 1/2 years How have you been treating your wound(s) until nowo unna boot Has your wound(s) ever healed and then re-openedo Yes Have you had any lab work done in the past montho No Have you tested positive for an antibiotic resistant organism (MRSA, VRE)o No Have you tested positive for osteomyelitis (bone infection)o No Have you had any tests for circulation on your legso No Have you had other problems associated with your woundso Swelling Cardiovascular Complaints and Symptoms: Positive for: LE edema Medical History: Positive for: Congestive Heart Failure; Hypertension; Peripheral Arterial Disease Past Medical History Notes: venous insufficiency Constitutional Symptoms (General Health) Complaints and Symptoms: No Complaints or Symptoms Eyes Complaints and Symptoms: No Complaints or Symptoms Ear/Nose/Mouth/Throat Complaints and Symptoms: No Complaints or Symptoms Hematologic/Lymphatic ZAIRE, LEVESQUE ([id number]) Complaints and Symptoms: No Complaints or  Symptoms Medical History: Positive for: Lymphedema Respiratory Complaints and Symptoms: No Complaints or Symptoms Medical History: Positive for: Chronic Obstructive Pulmonary Disease (COPD); Sleep Apnea - CPAP Gastrointestinal  Complaints and Symptoms: No Complaints or Symptoms Endocrine Complaints and Symptoms: No Complaints or Symptoms Medical History: Positive for: Type II Diabetes Genitourinary Complaints and Symptoms: No Complaints or Symptoms Immunological Complaints and Symptoms: No Complaints or Symptoms Integumentary (Skin) Complaints and Symptoms: No Complaints or Symptoms Musculoskeletal Complaints and Symptoms: No Complaints or Symptoms Neurologic Complaints and Symptoms: No Complaints or Symptoms VICENTE, WEIDLER ([id number]) Oncologic Complaints and Symptoms: No Complaints or Symptoms Medical History: Negative for: Received Chemotherapy; Received Radiation Psychiatric Complaints and Symptoms: No Complaints or Symptoms Immunizations Pneumococcal Vaccine: Received Pneumococcal Vaccination: Yes Immunization Notes: up to date Family and Social History Current every day smoker; Marital Status - Married; Alcohol Use: Never; Drug Use: No History; Caffeine Use: Daily; Financial Concerns: No; Food, Clothing or Shelter Needs: No; Support System Lacking: No; Transportation Concerns: No; Advanced Directives: No; Patient does not want information on Advanced Directives Physician Affirmation I have reviewed and agree with the above information. Electronic Signature(s) Signed: 11/02/2015 3:58:21 PM By: Evlyn Kanner MD, FACS Signed: 11/02/2015 4:49:50 PM By: Curtis Sites Entered By: Evlyn Kanner on 11/02/2015 09:21:53 Fernando Mannan ([id number]) -------------------------------------------------------------------------------- SuperBill Details Patient Name: Fernando Mannan Date of Service: 11/02/2015 Medical Record Number: [id number] Patient Account  Number: [id number] Date of Birth/Sex: 1955/12/31 (60 y.o. Male) Treating RN: Curtis Sites Primary Care Physician: Rolm Gala Other Clinician: Referring Physician: Cleda Daub Treating Physician/Extender: Rudene Re in Treatment: 0 Diagnosis Coding ICD-10 Codes Code Description E11.622 Type 2 diabetes mellitus with other skin ulcer I89.0 Lymphedema, not elsewhere classified E66.01 Morbid (severe) obesity due to excess calories L97.222 Non-pressure chronic ulcer of left calf with fat layer exposed L97.212 Non-pressure chronic ulcer of right calf with fat layer exposed Facility Procedures CPT4 Code: 08657846 Description: 99213 - WOUND CARE VISIT-LEV 3 EST PT Modifier: Quantity: 1 Physician Procedures CPT4 Code: 9629528 Description: 99204 - WC PHYS LEVEL 4 - NEW PT ICD-10 Description Diagnosis E11.622 Type 2 diabetes mellitus with other skin ulcer I89.0 Lymphedema, not elsewhere classified E66.01 Morbid (severe) obesity due to excess calories 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

## 2015-11-03 NOTE — Progress Notes (Signed)
Fernando [name], [place] ([id number]) Visit Report for 11/02/2015 Abuse/Suicide Risk Screen Details Patient Name: Fernando [name], Fernando [name] 11/02/2015 8:00 Date of Service: AM Medical Record [id number] Number: Patient Account Number: [id number] Date of Birth/Sex: 1955/02/02 19(60 y.o. Male) Treating RN: Curtis Sitesorthy, Joanna Primary Care Physician: Rolm GalaGrandis, Heidi Other Clinician: Geri SeminoleSTEGMAYER, Treating Referring Physician: Roe CoombsBritto, Errol KIMBERLY Physician/Extender: Weeks in Treatment: 0 Abuse/Suicide Risk Screen Items Answer ABUSE/SUICIDE RISK SCREEN: Has anyone close to you tried to hurt or harm you recentlyo No Do you feel uncomfortable with anyone in your familyo No Has anyone forced you do things that you didnot want to doo No Do you have any thoughts of harming yourselfo No Patient displays signs or symptoms of abuse and/or neglect. No Electronic Signature(s) Signed: 11/02/2015 4:49:50 PM By: Curtis Sitesorthy, Joanna Entered By: Curtis Sitesorthy, Joanna on 11/02/2015 08:23:47 Fernando [name], Fernando [name] ([id number]) -------------------------------------------------------------------------------- Activities of Daily Living Details Patient Name: Fernando [name], Fernando [name] 11/02/2015 8:00 Date of Service: AM Medical Record [id number] Number: Patient Account Number: [id number] Date of Birth/Sex: 1955/02/02 21(60 y.o. Male) Treating RN: Curtis Sitesorthy, Joanna Primary Care Physician: Rolm GalaGrandis, Heidi Other Clinician: Geri SeminoleSTEGMAYER, Treating Referring Physician: Roe CoombsBritto, Errol KIMBERLY Physician/Extender: Weeks in Treatment: 0 Activities of Daily Living Items Answer Activities of Daily Living (Please select one for each item) Drive Automobile Completely Able Take Medications Completely Able Use Telephone Completely Able Care for Appearance Completely Able Use Toilet Completely Able Bath / Shower Completely Able Dress Self Completely Able Feed Self Completely Able Walk Completely Able Get In / Out Bed Completely Able Housework Completely Able Prepare  Meals Completely Able Handle Money Completely Able Shop for Self Completely Able Electronic Signature(s) Signed: 11/02/2015 4:49:50 PM By: Curtis Sitesorthy, Joanna Entered By: Curtis Sitesorthy, Joanna on 11/02/2015 08:24:07 Fernando [name], Fernando [name] ([id number]) -------------------------------------------------------------------------------- Education Assessment Details Patient Name: Fernando [name], Fernando [name] 11/02/2015 8:00 Date of Service: AM Medical Record [id number] Number: Patient Account Number: [id number] Date of Birth/Sex: 1955/02/02 24(60 y.o. Male) Treating RN: Curtis Sitesorthy, Joanna Primary Care Physician: Rolm GalaGrandis, Heidi Other Clinician: Geri SeminoleSTEGMAYER, Treating Referring Physician: Roe CoombsBritto, Errol KIMBERLY Physician/Extender: Tania AdeWeeks in Treatment: 0 Primary Learner Assessed: Patient Learning Preferences/Education Level/Primary Language Learning Preference: Explanation, Demonstration Highest Education Level: High School Preferred Language: English Cognitive Barrier Assessment/Beliefs Language Barrier: No Translator Needed: No Memory Deficit: No Emotional Barrier: No Cultural/Religious Beliefs Affecting Medical No Care: Physical Barrier Assessment Impaired Vision: No Impaired Hearing: No Decreased Hand dexterity: No Knowledge/Comprehension Assessment Knowledge Level: Medium Comprehension Level: Medium Ability to understand written Medium instructions: Ability to understand verbal Medium instructions: Motivation Assessment Anxiety Level: Calm Cooperation: Cooperative Education Importance: Acknowledges Need Interest in Health Problems: Asks Questions Perception: Coherent Willingness to Engage in Self- Medium Management Activities: Readiness to Engage in Self- Medium Management Activities: Fernando [name], Fernando [name] ([id number]) Electronic Signature(s) Signed: 11/02/2015 4:49:50 PM By: Curtis Sitesorthy, Joanna Entered By: Curtis Sitesorthy, Joanna on 11/02/2015 08:24:31 Fernando [name], Fernando [name]  ([id number]) -------------------------------------------------------------------------------- Fall Risk Assessment Details Patient Name: Fernando [name], Fernando [name] 11/02/2015 8:00 Date of Service: AM Medical Record [id number] Number: Patient Account Number: [id number] Date of Birth/Sex: 1955/02/02 60(60 y.o. Male) Treating RN: Curtis Sitesorthy, Joanna Primary Care Physician: Rolm GalaGrandis, Heidi Other Clinician: Geri SeminoleSTEGMAYER, Treating Referring Physician: Roe CoombsBritto, Errol KIMBERLY Physician/Extender: Tania AdeWeeks in Treatment: 0 Fall Risk Assessment Items Have you had 2 or more falls in the last 12 monthso 0 No Have you had any fall that resulted in injury in the last 12 monthso 0 No FALL RISK ASSESSMENT: History of falling - immediate or within 3 months 0 No Secondary diagnosis 0 No Ambulatory aid None/bed rest/wheelchair/nurse 0 Yes Crutches/cane/walker 0 No Furniture 0 No IV Access/Saline Lock 0 No Gait/Training Normal/bed rest/immobile  0 No Weak 10 Yes Impaired 0 No Mental Status Oriented to own ability 0 Yes Electronic Signature(s) Signed: 11/02/2015 4:49:50 PM By: Curtis Sitesorthy, Joanna Entered By: Curtis Sitesorthy, Joanna on 11/02/2015 08:25:02 Fernando [name], Fernando [name] ([id number]) -------------------------------------------------------------------------------- Foot Assessment Details Patient Name: Fernando [name], Fernando [name] 11/02/2015 8:00 Date of Service: AM Medical Record [id number] Number: Patient Account Number: [id number] Date of Birth/Sex: 09-05-1955 [age] y.o. Male) Treating RN: Curtis Sitesorthy, Joanna Primary Care Physician: Rolm GalaGrandis, Heidi Other Clinician: Geri SeminoleSTEGMAYER, Treating Referring Physician: Roe CoombsBritto, Errol KIMBERLY Physician/Extender: Weeks in Treatment: 0 Foot Assessment Items Site Locations + = Sensation present, - = Sensation absent, C = Callus, U = Ulcer R = Redness, W = Warmth, M = Maceration, PU = Pre-ulcerative lesion F = Fissure, S = Swelling, D = Dryness Assessment Right: Left: Other Deformity: No No Prior Foot Ulcer:  No No Prior Amputation: No No Charcot Joint: No No Ambulatory Status: Ambulatory Without Help Gait: Steady Electronic Signature(s) Signed: 11/02/2015 4:49:50 PM By: Curtis Sitesorthy, Joanna Entered By: Curtis Sitesorthy, Joanna on 11/02/2015 08:25:30 Fernando [name], Gregorey ([id number]) -------------------------------------------------------------------------------- Nutrition Risk Assessment Details Patient Name: Fernando [name], Diangelo 11/02/2015 8:00 Date of Service: AM Medical Record [id number] Number: Patient Account Number: [id number] Date of Birth/Sex: 09-05-1955 [age] y.o. Male) Treating RN: Curtis Sitesorthy, Joanna Primary Care Physician: Rolm GalaGrandis, Heidi Other Clinician: Geri SeminoleSTEGMAYER, Treating Referring Physician: Roe CoombsBritto, Errol KIMBERLY Physician/Extender: Weeks in Treatment: 0 Height (in): 64 Weight (lbs): 291 Body Mass Index (BMI): 49.9 Nutrition Risk Assessment Items NUTRITION RISK SCREEN: I have an illness or condition that made me change the kind and/or 0 No amount of food I eat I eat fewer than two meals per day 0 No I eat few fruits and vegetables, or milk products 0 No I have three or more drinks of beer, liquor or wine almost every day 0 No I have tooth or mouth problems that make it hard for me to eat 0 No I don't always have enough money to buy the food I need 0 No I eat alone most of the time 0 No I take three or more different prescribed or over-the-counter drugs a 1 Yes day Without wanting to, I have lost or gained 10 pounds in the last six 0 No months I am not always physically able to shop, cook and/or feed myself 0 No Nutrition Protocols Good Risk Protocol 0 No interventions needed Moderate Risk Protocol Electronic Signature(s) Signed: 11/02/2015 4:49:50 PM By: Curtis Sitesorthy, Joanna Entered By: Curtis Sitesorthy, Joanna on 11/02/2015 08:25:08

## 2015-11-03 NOTE — Progress Notes (Signed)
Fernando MannanKUEIDER, Conlan ([id number]) Visit Report for 11/02/2015 Allergy List Details Patient Name: Fernando MannanKUEIDER, Fernando [name] Date of Service: 11/02/2015 8:00 AM Medical Record Number: [id number] Patient Account Number: [id number] Date of Birth/Sex: 06-Jul-1955 [age] y.o. Male) Treating RN: Curtis Sitesorthy, Joanna Primary Care Physician: Rolm GalaGrandis, Heidi Other Clinician: Referring Physician: Cleda DaubSTEGMAYER, KIMBERLY Treating Physician/Extender: Rudene ReBritto, Errol Weeks in Treatment: 0 Allergies Active Allergies Augmentin codeine methialate Allergy Notes Electronic Signature(s) Signed: 11/02/2015 4:49:50 PM By: Curtis Sitesorthy, Joanna Entered By: Curtis Sitesorthy, Joanna on 11/02/2015 08:23:31 Fernando MannanKUEIDER, Fernando [name] ([id number]) -------------------------------------------------------------------------------- Arrival Information Details Patient Name: Fernando MannanKUEIDER, [name] Date of Service: 11/02/2015 8:00 AM Medical Record Number: [id number] Patient Account Number: [id number] Date of Birth/Sex: 06-Jul-1955 [age] y.o. Male) Treating RN: Curtis Sitesorthy, Joanna Primary Care Physician: Rolm GalaGrandis, Heidi Other Clinician: Referring Physician: Cleda DaubSTEGMAYER, KIMBERLY Treating Physician/Extender: Rudene ReBritto, Errol Weeks in Treatment: 0 Visit Information Patient Arrived: Ambulatory Arrival Time: 08:17 Accompanied By: spouse Transfer Assistance: None Patient Identification Verified: Yes Secondary Verification Process Yes Completed: Patient Has Alerts: Yes Patient Alerts: DMII ABI [place] RIGHT >220 Electronic Signature(s) Signed: 11/02/2015 4:49:50 PM By: Curtis Sitesorthy, Joanna Entered By: Curtis Sitesorthy, Joanna on 11/02/2015 08:55:05 Fernando MannanKUEIDER, Fernando [name] ([id number]) -------------------------------------------------------------------------------- Clinic Level of Care Assessment Details Patient Name: Fernando MannanKUEIDER, [name] Date of Service: 11/02/2015 8:00 AM Medical Record Number: [id number] Patient Account Number: [id number] Date of Birth/Sex: 06-Jul-1955 [age] y.o. Male) Treating RN: Curtis Sitesorthy,  Joanna Primary Care Physician: Rolm GalaGrandis, Heidi Other Clinician: Referring Physician: Cleda DaubSTEGMAYER, KIMBERLY Treating Physician/Extender: Rudene ReBritto, Errol Weeks in Treatment: 0 Clinic Level of Care Assessment Items TOOL 1 Quantity Score []  - Use when EandM and Procedure is performed on INITIAL visit 0 ASSESSMENTS - Nursing Assessment / Reassessment X - General Physical Exam (combine w/ comprehensive assessment (listed just 1 20 below) when performed on new pt. evals) X - Comprehensive Assessment (HX, ROS, Risk Assessments, Wounds Hx, etc.) 1 25 ASSESSMENTS - Wound and Skin Assessment / Reassessment []  - Dermatologic / Skin Assessment (not related to wound area) 0 ASSESSMENTS - Ostomy and/or Continence Assessment and Care []  - Incontinence Assessment and Management 0 []  - Ostomy Care Assessment and Management (repouching, etc.) 0 PROCESS - Coordination of Care X - Simple Patient / Family Education for ongoing care 1 15 []  - Complex (extensive) Patient / Family Education for ongoing care 0 []  - Staff obtains ChiropractorConsents, Records, Test Results / Process Orders 0 []  - Staff telephones HHA, Nursing Homes / Clarify orders / etc 0 []  - Routine Transfer to another Facility (non-emergent condition) 0 []  - Routine Hospital Admission (non-emergent condition) 0 X - New Admissions / Manufacturing engineernsurance Authorizations / Ordering NPWT, Apligraf, etc. 1 15 []  - Emergency Hospital Admission (emergent condition) 0 PROCESS - Special Needs []  - Pediatric / Minor Patient Management 0 []  - Isolation Patient Management 0 Fernando MannanKUEIDER, Fernando [name] ([id number]) []  - Hearing / Language / Visual special needs 0 []  - Assessment of Community assistance (transportation, D/C planning, etc.) 0 []  - Additional assistance / Altered mentation 0 []  - Support Surface(s) Assessment (bed, cushion, seat, etc.) 0 INTERVENTIONS - Miscellaneous []  - External ear exam 0 []  - Patient Transfer (multiple staff / Nurse, adultHoyer Lift / Similar devices) 0 []  -  Simple Staple / Suture removal (25 or less) 0 []  - Complex Staple / Suture removal (26 or more) 0 []  - Hypo/Hyperglycemic Management (do not check if billed separately) 0 X - Ankle / Brachial Index (ABI) - do not check if billed separately 1 15 Has the patient been seen at the hospital within the last three years: Yes Total Score: 90 Level Of Care: New/Established -  Level 3 Electronic Signature(s) Signed: 11/02/2015 4:49:50 PM By: Curtis Sites Entered By: Curtis Sites on 11/02/2015 09:20:06 Fernando [name] ([id number]) -------------------------------------------------------------------------------- Compression Therapy Details Patient Name: Fernando [name] Date of Service: 11/02/2015 8:00 AM Medical Record Number: [id number] Patient Account Number: [id number] Date of Birth/Sex: [birth year] (60 y.o. Male) Treating RN: Curtis Sites Primary Care Physician: Rolm Gala Other Clinician: Referring Physician: Cleda Daub Treating Physician/Extender: Rudene Re in Treatment: 0 Compression Therapy Performed for Wound Wound #2 Left,Lateral Lower Leg Assessment: Performed By: Clinician Curtis Sites, RN Compression Type: Three Layer Pre Treatment ABI: 1.3 Post Procedure Diagnosis Same as Pre-procedure Electronic Signature(s) Signed: 11/02/2015 4:49:50 PM By: Curtis Sites Entered By: Curtis Sites on 11/02/2015 09:19:10 Fernando [name] ([id number]) -------------------------------------------------------------------------------- Compression Therapy Details Patient Name: Fernando [name] Date of Service: 11/02/2015 8:00 AM Medical Record Number: [id number] Patient Account Number: [id number] Date of Birth/Sex: [birth year] (60 y.o. Male) Treating RN: Curtis Sites Primary Care Physician: Rolm Gala Other Clinician: Referring Physician: Cleda Daub Treating Physician/Extender: Rudene Re in Treatment: 0 Compression Therapy Performed for Wound  Wound #1 Right,Anterior Lower Leg Assessment: Performed By: Clinician Curtis Sites, RN Compression Type: Three Layer Post Procedure Diagnosis Same as Pre-procedure Electronic Signature(s) Signed: 11/02/2015 4:49:50 PM By: Curtis Sites Entered By: Curtis Sites on 11/02/2015 09:19:49 Fernando [name] ([id number]) -------------------------------------------------------------------------------- Encounter Discharge Information Details Patient Name: Fernando [name] Date of Service: 11/02/2015 8:00 AM Medical Record Number: [id number] Patient Account Number: [id number] Date of Birth/Sex: 1955-09-03 (60 y.o. Male) Treating RN: Curtis Sites Primary Care Physician: Rolm Gala Other Clinician: Referring Physician: Cleda Daub Treating Physician/Extender: Rudene Re in Treatment: 0 Encounter Discharge Information Items Discharge Pain Level: 0 Discharge Condition: Stable Ambulatory Status: Ambulatory Discharge Destination: Home Transportation: Private Auto Accompanied By: spouse Schedule Follow-up Appointment: Yes Medication Reconciliation completed and provided to Patient/Care No Darlinda Bellows: Provided on Clinical Summary of Care: 11/02/2015 Form Type Recipient Paper Patient SK Electronic Signature(s) Signed: 11/02/2015 10:49:13 AM By: Curtis Sites Previous Signature: 11/02/2015 9:50:05 AM Version By: Gwenlyn Perking Entered By: Curtis Sites on 11/02/2015 10:49:13 Fernando [name] ([id number]) -------------------------------------------------------------------------------- Lower Extremity Assessment Details Patient Name: Fernando [name] Date of Service: 11/02/2015 8:00 AM Medical Record Number: [id number] Patient Account Number: [id number] Date of Birth/Sex: 1955/10/08 (60 y.o. Male) Treating RN: Curtis Sites Primary Care Physician: Rolm Gala Other Clinician: Referring Physician: Cleda Daub Treating Physician/Extender: Rudene Re in Treatment: 0 Edema Assessment Assessed: [Left: No] [Right: No] Edema: [Left: Yes] [Right: Yes] Calf Left: Right: Point of Measurement: 35 cm From Medial Instep 48.7 cm 50 cm Ankle Left: Right: Point of Measurement: 10 cm From Medial Instep 27.4 cm 26.7 cm Vascular Assessment Pulses: Posterior Tibial Palpable: [Left:Yes] [Right:Yes] Doppler: [Left:Monophasic] [Right:Monophasic] Dorsalis Pedis Palpable: [Left:Yes] [Right:Yes] Doppler: [Left:Monophasic] [Right:Monophasic] Extremity colors, hair growth, and conditions: Extremity Color: [Left:Hyperpigmented] [Right:Hyperpigmented] Hair Growth on Extremity: [Left:Yes] [Right:Yes] Temperature of Extremity: [Left:Warm] [Right:Warm] Capillary Refill: [Left:< 3 seconds] [Right:< 3 seconds] Blood Pressure: Brachial: [Left:122] Dorsalis Pedis: 122 [Left:Dorsalis Pedis:] Ankle: Posterior Tibial: 164 [Left:Posterior Tibial: 1.34] Toe Nail Assessment Left: Right: Thick: Yes Yes Discolored: Yes Yes Deformed: Yes Yes Improper Length and Hygiene: Yes Yes DAEGON, DEISS ([id number]) Notes ABI [place] RIGHT >220 Electronic Signature(s) Signed: 11/02/2015 4:49:50 PM By: Curtis Sites Entered By: Curtis Sites on 11/02/2015 08:57:26 Fernando [name] ([id number]) -------------------------------------------------------------------------------- Multi Wound Chart Details Patient Name: Fernando [name] Date of Service: 11/02/2015 8:00 AM Medical Record Number: [id number] Patient Account Number: [id number] Date of Birth/Sex: 08/23/1955 (60 y.o. Male) Treating RN: Curtis Sites Primary Care Physician: Rolm Gala Other  Clinician: Referring Physician: Cleda DaubSTEGMAYER, KIMBERLY Treating Physician/Extender: Rudene ReBritto, Errol Weeks in Treatment: 0 Vital Signs Height(in): 64 Pulse(bpm): 75 Weight(lbs): 291 Blood Pressure 118/60 (mmHg): Body Mass Index(BMI): 50 Temperature(F): 98.4 Respiratory Rate 22 (breaths/min): Photos:  [N/A:N/A] Wound Location: Right Lower Leg - Anterior Left Lower Leg - Lateral N/A Wounding Event: Gradually Appeared Gradually Appeared N/A Primary Etiology: Lymphedema Lymphedema N/A Secondary Etiology: Venous Leg Ulcer Venous Leg Ulcer N/A Comorbid History: Lymphedema, Chronic Lymphedema, Chronic N/A Obstructive Pulmonary Obstructive Pulmonary Disease (COPD), Sleep Disease (COPD), Sleep Apnea, Congestive Heart Apnea, Congestive Heart Failure, Hypertension, Failure, Hypertension, Peripheral Arterial Peripheral Arterial Disease, Type II Diabetes Disease, Type II Diabetes Date Acquired: 07/03/2013 07/03/2013 N/A Weeks of Treatment: 0 0 N/A Wound Status: Open Open N/A Measurements L x W x D 5.8x7.9x0.2 5x6.5x0.1 N/A (cm) Area (cm) : 35.987 25.525 N/A Volume (cm) : 7.197 2.553 N/A Classification: Full Thickness Without Partial Thickness N/A Exposed Support Structures HBO Classification: Grade 1 Grade 1 N/A Exudate Amount: Large Large N/A Fernando MannanKUEIDER, Rowland ([id number]) Exudate Type: Serous Serous N/A Exudate Color: amber amber N/A Wound Margin: Flat and Intact Flat and Intact N/A Granulation Amount: Small (1-33%) Small (1-33%) N/A Granulation Quality: Pink Pink N/A Necrotic Amount: Large (67-100%) Large (67-100%) N/A Exposed Structures: Fat: Yes Fascia: No N/A Fascia: No Fat: No Tendon: No Tendon: No Muscle: No Muscle: No Joint: No Joint: No Bone: No Bone: No Limited to Skin Breakdown Epithelialization: Small (1-33%) Small (1-33%) N/A Periwound Skin Texture: Edema: No Edema: No N/A Excoriation: No Excoriation: No Induration: No Induration: No Callus: No Callus: No Crepitus: No Crepitus: No Fluctuance: No Fluctuance: No Friable: No Friable: No Rash: No Rash: No Scarring: No Scarring: No Periwound Skin Maceration: Yes Maceration: No N/A Moisture: Moist: Yes Moist: No Dry/Scaly: No Dry/Scaly: No Periwound Skin Color: Atrophie Blanche: No Atrophie Blanche:  No N/A Cyanosis: No Cyanosis: No Ecchymosis: No Ecchymosis: No Erythema: No Erythema: No Hemosiderin Staining: No Hemosiderin Staining: No Mottled: No Mottled: No Pallor: No Pallor: No Rubor: No Rubor: No Tenderness on Yes Yes N/A Palpation: Wound Preparation: Ulcer Cleansing: Ulcer Cleansing: N/A Rinsed/Irrigated with Rinsed/Irrigated with Saline Saline Topical Anesthetic Topical Anesthetic Applied: Other: lidocaine Applied: Other: lidocaine 4% 4% Treatment Notes Electronic Signature(s) Signed: 11/02/2015 4:49:50 PM By: Lynnell Catalanorthy, Joanna Sabas, Maximiano ([id number]) Entered By: Curtis Sitesorthy, Joanna on 11/02/2015 09:15:49 Fernando MannanKUEIDER, Alexa ([id number]) -------------------------------------------------------------------------------- Multi-Disciplinary Care Plan Details Patient Name: Fernando MannanKUEIDER, Gerlad Date of Service: 11/02/2015 8:00 AM Medical Record Number: [id number] Patient Account Number: [id number] Date of Birth/Sex: 10/27/1955 [age] y.o. Male) Treating RN: Curtis Sitesorthy, Joanna Primary Care Physician: Rolm GalaGrandis, Heidi Other Clinician: Referring Physician: Cleda DaubSTEGMAYER, KIMBERLY Treating Physician/Extender: Rudene ReBritto, Errol Weeks in Treatment: 0 Active Inactive Abuse / Safety / Falls / Self Care Management Nursing Diagnoses: Impaired physical mobility Potential for falls Goals: Patient will remain injury free Date Initiated: 11/02/2015 Goal Status: Active Interventions: Assess fall risk on admission and as needed Notes: Orientation to the Wound Care Program Nursing Diagnoses: Knowledge deficit related to the wound healing center program Goals: Patient/caregiver will verbalize understanding of the Wound Healing Center Program Date Initiated: 11/02/2015 Goal Status: Active Interventions: Provide education on orientation to the wound center Notes: Wound/Skin Impairment Nursing Diagnoses: Impaired tissue integrity Goals: Patient/caregiver will verbalize understanding of skin care  regimen Fernando MannanKUEIDER, Corneilus ([id number]) Date Initiated: 11/02/2015 Goal Status: Active Ulcer/skin breakdown will have a volume reduction of 30% by week 4 Date Initiated: 11/02/2015 Goal Status: Active Ulcer/skin breakdown will have a volume reduction of 50% by week 8 Date Initiated: 11/02/2015 Goal Status:  Active Ulcer/skin breakdown will have a volume reduction of 80% by week 12 Date Initiated: 11/02/2015 Goal Status: Active Ulcer/skin breakdown will heal within 14 weeks Date Initiated: 11/02/2015 Goal Status: Active Interventions: Assess patient/caregiver ability to obtain necessary supplies Assess patient/caregiver ability to perform ulcer/skin care regimen upon admission and as needed Assess ulceration(s) every visit Notes: Electronic Signature(s) Signed: 11/02/2015 4:49:50 PM By: Curtis Sites Entered By: Curtis Sites on 11/02/2015 09:15:31 Fernando [name] ([id number]) -------------------------------------------------------------------------------- Pain Assessment Details Patient Name: Fernando [name] Date of Service: 11/02/2015 8:00 AM Medical Record Number: [id number] Patient Account Number: [id number] Date of Birth/Sex: 25-Sep-1955 (60 y.o. Male) Treating RN: Curtis Sites Primary Care Physician: Rolm Gala Other Clinician: Referring Physician: Cleda Daub Treating Physician/Extender: Rudene Re in Treatment: 0 Active Problems Location of Pain Severity and Description of Pain Patient Has Paino Yes Site Locations Pain Location: Pain in Ulcers With Dressing Change: Yes Duration of the Pain. Constant / Intermittento Constant Pain Management and Medication Current Pain Management: Notes Topical or injectable lidocaine is offered to patient for acute pain when surgical debridement is performed. If needed, Patient is instructed to use over the counter pain medication for the following 24-48 hours after debridement. Wound care MDs do not  prescribed pain medications. Patient has chronic pain or uncontrolled pain. Patient has been instructed to make an appointment with their Primary Care Physician for pain management. Electronic Signature(s) Signed: 11/02/2015 4:49:50 PM By: Curtis Sites Entered By: Curtis Sites on 11/02/2015 08:19:41 Fernando [name] ([id number]) -------------------------------------------------------------------------------- Patient/Caregiver Education Details Patient Name: Fernando [name] Date of Service: 11/02/2015 8:00 AM Medical Record Number: [id number] Patient Account Number: [id number] Date of Birth/Gender: 1955-12-09 (60 y.o. Male) Treating RN: Curtis Sites Primary Care Physician: Rolm Gala Other Clinician: Referring Physician: Cleda Daub Treating Physician/Extender: Rudene Re in Treatment: 0 Education Assessment Education Provided To: Patient Education Topics Provided Venous: Handouts: Controlling Swelling with Multilayered Compression Wraps Methods: Demonstration, Explain/Verbal, Printed Responses: State content correctly Electronic Signature(s) Signed: 11/02/2015 4:49:50 PM By: Curtis Sites Entered By: Curtis Sites on 11/02/2015 10:49:41 Fernando [name] ([id number]) -------------------------------------------------------------------------------- Wound Assessment Details Patient Name: Fernando [name] Date of Service: 11/02/2015 8:00 AM Medical Record Number: [id number] Patient Account Number: [id number] Date of Birth/Sex: [birth year] (60 y.o. Male) Treating RN: Curtis Sites Primary Care Physician: Rolm Gala Other Clinician: Referring Physician: Cleda Daub Treating Physician/Extender: Rudene Re in Treatment: 0 Wound Status Wound Number: 1 Primary Lymphedema Etiology: Wound Location: Right Lower Leg - Anterior Secondary Venous Leg Ulcer Wounding Event: Gradually Appeared Etiology: Date Acquired: 07/03/2013 Wound  Open Weeks Of Treatment: 0 Status: Clustered Wound: No Comorbid Lymphedema, Chronic Obstructive History: Pulmonary Disease (COPD), Sleep Apnea, Congestive Heart Failure, Hypertension, Peripheral Arterial Disease, Type II Diabetes Photos Wound Measurements Length: (cm) 5.8 Width: (cm) 7.9 Depth: (cm) 0.2 Area: (cm) 35.987 Volume: (cm) 7.197 % Reduction in Area: % Reduction in Volume: Epithelialization: Small (1-33%) Tunneling: No Undermining: No Wound Description Full Thickness Without Foul Odor Afte Classification: Exposed Support Structures Diabetic Severity Grade 1 (Wagner): Wound Margin: Flat and Intact Exudate Amount: Large Exudate Type: Serous Exudate Color: amber JAKUB, DEBOLD ([id number]) r Cleansing: No Wound Bed Granulation Amount: Small (1-33%) Exposed Structure Granulation Quality: Pink Fascia Exposed: No Necrotic Amount: Large (67-100%) Fat Layer Exposed: Yes Necrotic Quality: Adherent Slough Tendon Exposed: No Muscle Exposed: No Joint Exposed: No Bone Exposed: No Periwound Skin Texture Texture Color No Abnormalities Noted: No No Abnormalities Noted: No Callus: No Atrophie Blanche: No Crepitus: No Cyanosis: No Excoriation: No Ecchymosis: No Fluctuance: No Erythema: No  Friable: No Hemosiderin Staining: No Induration: No Mottled: No Localized Edema: No Pallor: No Rash: No Rubor: No Scarring: No Temperature / Pain Moisture Tenderness on Palpation: Yes No Abnormalities Noted: No Dry / Scaly: No Maceration: Yes Moist: Yes Wound Preparation Ulcer Cleansing: Rinsed/Irrigated with Saline Topical Anesthetic Applied: Other: lidocaine 4%, Treatment Notes Wound #1 (Right, Anterior Lower Leg) 2. Anesthetic Topical Lidocaine 4% cream to wound bed prior to debridement 4. Dressing Applied: Aquacel Ag Other dressing (specify in notes) 5. Secondary Dressing Applied ABD Pad 7. Secured with 3 Layer Compression System -  Bilateral Notes xtrasorb Electronic Signature(s) KAIDIN, BOEHLE ([id number]) Signed: 11/02/2015 4:49:50 PM By: Curtis Sites Entered By: Curtis Sites on 11/02/2015 08:40:02 Fernando [name] ([id number]) -------------------------------------------------------------------------------- Wound Assessment Details Patient Name: Fernando [name] Date of Service: 11/02/2015 8:00 AM Medical Record Number: [id number] Patient Account Number: [id number] Date of Birth/Sex: May 02, 1955 (60 y.o. Male) Treating RN: Curtis Sites Primary Care Physician: Rolm Gala Other Clinician: Referring Physician: Cleda Daub Treating Physician/Extender: Rudene Re in Treatment: 0 Wound Status Wound Number: 2 Primary Lymphedema Etiology: Wound Location: Left Lower Leg - Lateral Secondary Venous Leg Ulcer Wounding Event: Gradually Appeared Etiology: Date Acquired: 07/03/2013 Wound Open Weeks Of Treatment: 0 Status: Clustered Wound: No Comorbid Lymphedema, Chronic Obstructive History: Pulmonary Disease (COPD), Sleep Apnea, Congestive Heart Failure, Hypertension, Peripheral Arterial Disease, Type II Diabetes Photos Wound Measurements Length: (cm) 5 Width: (cm) 6.5 Depth: (cm) 0.1 Area: (cm) 25.525 Volume: (cm) 2.553 % Reduction in Area: % Reduction in Volume: Epithelialization: Small (1-33%) Tunneling: No Undermining: No Wound Description Classification: Partial Thickness Foul Odor Afte Diabetic Severity (Wagner): Grade 1 Wound Margin: Flat and Intact Exudate Amount: Large Exudate Type: Serous Exudate Color: amber r Cleansing: No Wound Bed KAMERON, BLETHEN ([id number]) Granulation Amount: Small (1-33%) Exposed Structure Granulation Quality: Pink Fascia Exposed: No Necrotic Amount: Large (67-100%) Fat Layer Exposed: No Necrotic Quality: Adherent Slough Tendon Exposed: No Muscle Exposed: No Joint Exposed: No Bone Exposed: No Limited to Skin Breakdown Periwound  Skin Texture Texture Color No Abnormalities Noted: No No Abnormalities Noted: No Callus: No Atrophie Blanche: No Crepitus: No Cyanosis: No Excoriation: No Ecchymosis: No Fluctuance: No Erythema: No Friable: No Hemosiderin Staining: No Induration: No Mottled: No Localized Edema: No Pallor: No Rash: No Rubor: No Scarring: No Temperature / Pain Moisture Tenderness on Palpation: Yes No Abnormalities Noted: No Dry / Scaly: No Maceration: No Moist: No Wound Preparation Ulcer Cleansing: Rinsed/Irrigated with Saline Topical Anesthetic Applied: Other: lidocaine 4%, Treatment Notes Wound #2 (Left, Lateral Lower Leg) 2. Anesthetic Topical Lidocaine 4% cream to wound bed prior to debridement 4. Dressing Applied: Aquacel Ag Other dressing (specify in notes) 5. Secondary Dressing Applied ABD Pad 7. Secured with 3 Layer Compression System - Bilateral Notes xtrasorb Electronic Signature(s) KAESEN, RODRIGUEZ ([id number]) Signed: 11/02/2015 4:49:50 PM By: Curtis Sites Entered By: Curtis Sites on 11/02/2015 08:41:21 Fernando [name] ([id number]) -------------------------------------------------------------------------------- Vitals Details Patient Name: Fernando [name] Date of Service: 11/02/2015 8:00 AM Medical Record Number: [id number] Patient Account Number: [id number] Date of Birth/Sex: [birth year] (60 y.o. Male) Treating RN: Curtis Sites Primary Care Physician: Rolm Gala Other Clinician: Referring Physician: Cleda Daub Treating Physician/Extender: Rudene Re in Treatment: 0 Vital Signs Time Taken: 08:19 Temperature (F): 98.4 Height (in): 64 Pulse (bpm): 75 Source: Stated Respiratory Rate (breaths/min): 22 Weight (lbs): 291 Blood Pressure (mmHg): 118/60 Source: Stated Reference Range: 80 - 120 mg / dl Body Mass Index (BMI): 49.9 Electronic Signature(s) Signed: 11/02/2015 4:49:50 PM By: Curtis Sites Entered By: Curtis Sites on  11/02/2015 08:21:54 

## 2015-11-05 ENCOUNTER — Encounter: Payer: PPO | Attending: Surgery

## 2015-11-05 ENCOUNTER — Ambulatory Visit: Payer: Medicare Other | Admitting: Surgery

## 2015-11-05 DIAGNOSIS — I2510 Atherosclerotic heart disease of native coronary artery without angina pectoris: Secondary | ICD-10-CM | POA: Insufficient documentation

## 2015-11-05 DIAGNOSIS — L97222 Non-pressure chronic ulcer of left calf with fat layer exposed: Secondary | ICD-10-CM | POA: Diagnosis not present

## 2015-11-05 DIAGNOSIS — Z6841 Body Mass Index (BMI) 40.0 and over, adult: Secondary | ICD-10-CM | POA: Insufficient documentation

## 2015-11-05 DIAGNOSIS — E11622 Type 2 diabetes mellitus with other skin ulcer: Secondary | ICD-10-CM | POA: Insufficient documentation

## 2015-11-05 DIAGNOSIS — I110 Hypertensive heart disease with heart failure: Secondary | ICD-10-CM | POA: Insufficient documentation

## 2015-11-05 DIAGNOSIS — I890 Lymphedema, not elsewhere classified: Secondary | ICD-10-CM | POA: Insufficient documentation

## 2015-11-05 DIAGNOSIS — F17210 Nicotine dependence, cigarettes, uncomplicated: Secondary | ICD-10-CM | POA: Diagnosis not present

## 2015-11-05 DIAGNOSIS — I509 Heart failure, unspecified: Secondary | ICD-10-CM | POA: Insufficient documentation

## 2015-11-05 DIAGNOSIS — G4733 Obstructive sleep apnea (adult) (pediatric): Secondary | ICD-10-CM | POA: Diagnosis not present

## 2015-11-05 DIAGNOSIS — J449 Chronic obstructive pulmonary disease, unspecified: Secondary | ICD-10-CM | POA: Diagnosis not present

## 2015-11-05 DIAGNOSIS — L97212 Non-pressure chronic ulcer of right calf with fat layer exposed: Secondary | ICD-10-CM | POA: Diagnosis not present

## 2015-11-06 NOTE — Progress Notes (Signed)
[name], Norwin ([id number]) Visit Report for 11/05/2015 Arrival Information Details Patient Name: [name], Anuel Date of Service: 11/05/2015 3:45 PM Medical Record Number: [id number] Patient Account Number: [id number] Date of Birth/Sex: 07/02/1955 [age] y.o. Male) Treating RN: Huel CoventryWoody, Kim Primary Care Physician: Rolm GalaGrandis, Heidi Other Clinician: Referring Physician: Rolm GalaGrandis, Heidi Treating Physician/Extender: Rudene ReBritto, Errol Weeks in Treatment: 0 Visit Information History Since Last Visit Added or deleted any medications: No Patient Arrived: Ambulatory Any new allergies or adverse reactions: No Arrival Time: 15:52 Had a fall or experienced change in No Transfer Assistance: None activities of daily living that may affect Patient Identification Verified: Yes risk of falls: Secondary Verification Process Yes Signs or symptoms of abuse/neglect since last No Completed: visito Patient Has Alerts: Yes Hospitalized since last visit: No Patient Alerts: DMII Has Dressing in Place as Prescribed: No ABI [place] RIGHT Has Compression in Place as Prescribed: No >220 Pain Present Now: No Electronic Signature(s) Signed: 11/05/2015 5:34:48 PM By: Elliot GurneyWoody, RN, BSN, Kim RN, BSN Entered By: Elliot GurneyWoody, RN, BSN, Kim on 11/05/2015 15:52:34 [name], Tami ([id number]) -------------------------------------------------------------------------------- Compression Therapy Details Patient Name: [name], Dallis Date of Service: 11/05/2015 3:45 PM Medical Record Number: [id number] Patient Account Number: [id number] Date of Birth/Sex: 07/02/1955 [age] y.o. Male) Treating RN: Huel CoventryWoody, Kim Primary Care Physician: Rolm GalaGrandis, Heidi Other Clinician: Referring Physician: Rolm GalaGrandis, Heidi Treating Physician/Extender: Rudene ReBritto, Errol Weeks in Treatment: 0 Compression Therapy Performed for Wound Wound #1 Right,Anterior Lower Leg Assessment: Performed By: Clinician Huel CoventryWoody, Kim, RN Compression Type: Three Layer Pre Treatment ABI:  1.3 Electronic Signature(s) Signed: 11/05/2015 5:34:48 PM By: Elliot GurneyWoody, RN, BSN, Kim RN, BSN Entered By: Elliot GurneyWoody, RN, BSN, Kim on 11/05/2015 15:56:09 [name], Latonya ([id number]) -------------------------------------------------------------------------------- Compression Therapy Details Patient Name: [name], Guhan Date of Service: 11/05/2015 3:45 PM Medical Record Number: [id number] Patient Account Number: [id number] Date of Birth/Sex: 07/02/1955 [age] y.o. Male) Treating RN: Huel CoventryWoody, Kim Primary Care Physician: Rolm GalaGrandis, Heidi Other Clinician: Referring Physician: Rolm GalaGrandis, Heidi Treating Physician/Extender: Rudene ReBritto, Errol Weeks in Treatment: 0 Compression Therapy Performed for Wound Wound #2 Left,Lateral Lower Leg Assessment: Performed By: Clinician Huel CoventryWoody, Kim, RN Compression Type: Three Layer Pre Treatment ABI: 1.3 Electronic Signature(s) Signed: 11/05/2015 5:34:48 PM By: Elliot GurneyWoody, RN, BSN, Kim RN, BSN Entered By: Elliot GurneyWoody, RN, BSN, Kim on 11/05/2015 15:56:10 [name], Serge ([id number]) -------------------------------------------------------------------------------- Encounter Discharge Information Details Patient Name: [name], Leonid Date of Service: 11/05/2015 3:45 PM Medical Record Number: [id number] Patient Account Number: [id number] Date of Birth/Sex: 07/02/1955 [age] y.o. Male) Treating RN: Huel CoventryWoody, Kim Primary Care Physician: Rolm GalaGrandis, Heidi Other Clinician: Referring Physician: Rolm GalaGrandis, Heidi Treating Physician/Extender: Rudene ReBritto, Errol Weeks in Treatment: 0 Encounter Discharge Information Items Discharge Pain Level: 0 Discharge Condition: Stable Ambulatory Status: Ambulatory Discharge Destination: Home Private Transportation: Auto Accompanied By: wife Schedule Follow-up Appointment: Yes Medication Reconciliation completed and Yes provided to Patient/Care Tris Howell: Clinical Summary of Care: Electronic Signature(s) Signed: 11/05/2015 5:34:48 PM By: Elliot GurneyWoody, RN, BSN, Kim RN,  BSN Entered By: Elliot GurneyWoody, RN, BSN, Kim on 11/05/2015 16:09:12 [name], Killian ([id number]) -------------------------------------------------------------------------------- Patient/Caregiver Education Details Patient Name: [name], Hendrixx Date of Service: 11/05/2015 3:45 PM Medical Record Number: [id number] Patient Account Number: [id number] Date of Birth/Gender: 07/02/1955 [age] y.o. Male) Treating RN: Huel CoventryWoody, Kim Primary Care Physician: Rolm GalaGrandis, Heidi Other Clinician: Referring Physician: Rolm GalaGrandis, Heidi Treating Physician/Extender: Rudene ReBritto, Errol Weeks in Treatment: 0 Education Assessment Education Provided To: Patient Education Topics Provided [facility]: Handouts: [facility] Methods: Demonstration Responses: State content correctly Electronic Signature(s) Signed: 11/05/2015 5:34:48 PM By: Elliot GurneyWoody, RN, BSN, Kim RN, BSN Entered By:  Elliot GurneyWoody, RN, BSN, Kim on 11/05/2015 16:08:51 [name], Jantzen ([id number]) -------------------------------------------------------------------------------- Wound Assessment Details Patient Name: [name], Tulio Date of Service: 11/05/2015 3:45 PM Medical Record Number: [id number] Patient Account Number: [id number] Date of Birth/Sex: 03-13-1955 [age] y.o. Male) Treating RN: Huel CoventryWoody, Kim Primary Care Physician: Rolm GalaGrandis, Heidi Other Clinician: Referring Physician: Rolm GalaGrandis, Heidi Treating Physician/Extender: Rudene ReBritto, Errol Weeks in Treatment: 0 Wound Status Wound Number: 1 Primary Etiology: Lymphedema Wound Location: Right, Anterior Lower Leg Secondary Etiology: Venous Leg Ulcer Wounding Event: Gradually Appeared Wound Status: Open Date Acquired: 07/03/2013 Weeks Of Treatment: 0 Clustered Wound: No Photos Photo Uploaded By: Elliot GurneyWoody, RN, BSN, Kim on 11/05/2015 16:13:21 Wound Measurements Length: (cm) 6 Width: (cm) 10 Depth: (cm) 0.3 Area: (cm) 47.124 Volume: (cm) 14.137 % Reduction in Area: -30.9% % Reduction in  Volume: -96.4% Wound Description Full Thickness Without Exposed Classification: Support Structures Periwound Skin Texture Texture Color No Abnormalities Noted: No No Abnormalities Noted: No Moisture No Abnormalities Noted: No Treatment Notes Wound #1 (Right, Anterior Lower Leg) 4. Dressing Applied: Aquacel Ag [name], Latrelle ([id number]) 5. Secondary Dressing Applied Bordered Foam Dressing 7. Secured with 3 Layer Compression System - Bilateral Electronic Signature(s) Signed: 11/05/2015 5:34:48 PM By: Elliot GurneyWoody, RN, BSN, Kim RN, BSN Entered By: Elliot GurneyWoody, RN, BSN, Kim on 11/05/2015 15:55:03 [name], Miko ([id number]) -------------------------------------------------------------------------------- Wound Assessment Details Patient Name: [name], Xavior Date of Service: 11/05/2015 3:45 PM Medical Record Number: [id number] Patient Account Number: [id number] Date of Birth/Sex: 03-13-1955 [age] y.o. Male) Treating RN: Huel CoventryWoody, Kim Primary Care Physician: Rolm GalaGrandis, Heidi Other Clinician: Referring Physician: Rolm GalaGrandis, Heidi Treating Physician/Extender: Rudene ReBritto, Errol Weeks in Treatment: 0 Wound Status Wound Number: 2 Primary Etiology: Lymphedema Wound Location: Left, Lateral Lower Leg Secondary Etiology: Venous Leg Ulcer Wounding Event: Gradually Appeared Wound Status: Open Date Acquired: 07/03/2013 Weeks Of Treatment: 0 Clustered Wound: No Photos Photo Uploaded By: Elliot GurneyWoody, RN, BSN, Kim on 11/05/2015 16:13:21 Wound Measurements Length: (cm) 4 Width: (cm) 6.5 Depth: (cm) 0.1 Area: (cm) 20.42 Volume: (cm) 2.042 % Reduction in Area: 20% % Reduction in Volume: 20% Wound Description Classification: Partial Thickness Periwound Skin Texture Texture Color No Abnormalities Noted: No No Abnormalities Noted: No Moisture No Abnormalities Noted: No Treatment Notes Wound #2 (Left, Lateral Lower Leg) 4. Dressing Applied: Aquacel Ag 5. Secondary Dressing Applied [name], Yehonatan  ([id number]) Bordered Foam Dressing 7. Secured with 3 Layer Compression System - Bilateral Electronic Signature(s) Signed: 11/05/2015 5:34:48 PM By: Elliot GurneyWoody, RN, BSN, Kim RN, BSN Entered By: Elliot GurneyWoody, RN, BSN, Kim on 11/05/2015 15:55:03

## 2015-11-09 ENCOUNTER — Encounter: Payer: PPO | Admitting: Surgery

## 2015-11-09 DIAGNOSIS — L97821 Non-pressure chronic ulcer of other part of left lower leg limited to breakdown of skin: Secondary | ICD-10-CM | POA: Diagnosis not present

## 2015-11-09 DIAGNOSIS — E119 Type 2 diabetes mellitus without complications: Secondary | ICD-10-CM | POA: Diagnosis not present

## 2015-11-09 DIAGNOSIS — I890 Lymphedema, not elsewhere classified: Secondary | ICD-10-CM | POA: Diagnosis not present

## 2015-11-09 DIAGNOSIS — E11622 Type 2 diabetes mellitus with other skin ulcer: Secondary | ICD-10-CM | POA: Diagnosis not present

## 2015-11-09 DIAGNOSIS — L97811 Non-pressure chronic ulcer of other part of right lower leg limited to breakdown of skin: Secondary | ICD-10-CM | POA: Diagnosis not present

## 2015-11-09 NOTE — Progress Notes (Signed)
AKHILESH, SASSONE ([id number]) Visit Report for 11/09/2015 Chief Complaint Document Details Patient Name: Fernando [name], Fernando [name] Date of Service: 11/09/2015 8:45 AM Medical Record Number: [id number] Patient Account Number: [id number] Date of Birth/Sex: 05-13-1955 (60 y.o. Male) Treating RN: Phillis Haggis Primary Care Physician: Rolm Gala Other Clinician: Referring Physician: Rolm Gala Treating Physician/Extender: Rudene Re in Treatment: 1 Information Obtained from: Patient Chief Complaint Patient presents to the wound care center for a consult due non healing wound Both lower extremities with swelling and has had this for at least 4 years Electronic Signature(s) Signed: 11/09/2015 9:38:21 AM By: Evlyn Kanner MD, FACS Entered By: Evlyn Kanner on 11/09/2015 09:38:21 Fernando [name] ([id number]) -------------------------------------------------------------------------------- HPI Details Patient Name: Fernando [name] Date of Service: 11/09/2015 8:45 AM Medical Record Number: [id number] Patient Account Number: [id number] Date of Birth/Sex: August 28, 1955 (60 y.o. Male) Treating RN: Phillis Haggis Primary Care Physician: Rolm Gala Other Clinician: Referring Physician: Rolm Gala Treating Physician/Extender: Rudene Re in Treatment: 1 History of Present Illness Location: ulcers both lower extremities right worse than left Quality: Patient reports experiencing a dull pain to affected area(s). Severity: Patient states wound are getting worse. Duration: Patient has had the wound for > 3 years prior to seeking treatment at the wound center Timing: Pain in wound is Intermittent (comes and goes Context: The wound appeared gradually over time Modifying Factors: Consults to this date include:wound center at [facility] and cardiology Associated Signs and Symptoms: Patient reports having increase swelling. HPI Description: 60 year old gentleman who has diabetes mellitus,  been referred to was for use with lymphedema and peripheral vascular disease. Stand he's had a vascular workup ordered and has a scale opinion done recently. He is a smoker and smokes about 2 packs of cigarettes a day. past medical history significant for coronary artery disease, CHF, COPD, hyperlipidemia, hypertension, lower extremity edema, obesity, nicotine addiction, status post chronically occluded right coronary artery, history of obstructive sleep apnea treated with a CPAP machine. he is status post hernia repair, tonsillectomy, appendectomy and angioplasty. Note is Made that he has been treated at the wound center for chronic lymphedema with ulceration which improves with Unna's but recurs when he does not have them. hemoglobin A1c was 6.6%. 11/09/2015 -- the patient has not had his vascular tests so far and from what the wife tells me his compression wraps have been slipping and they need to be changed more frequently. He has several excuses for not stopping to smoke and we have address this again. Electronic Signature(s) Signed: 11/09/2015 9:38:59 AM By: Evlyn Kanner MD, FACS Entered By: Evlyn Kanner on 11/09/2015 09:38:59 Fernando [name] ([id number]) -------------------------------------------------------------------------------- Physical Exam Details Patient Name: Fernando [name] Date of Service: 11/09/2015 8:45 AM Medical Record Number: [id number] Patient Account Number: [id number] Date of Birth/Sex: October 23, 1955 (60 y.o. Male) Treating RN: Phillis Haggis Primary Care Physician: Rolm Gala Other Clinician: Referring Physician: Rolm Gala Treating Physician/Extender: Rudene Re in Treatment: 1 Constitutional . Pulse regular. Respirations normal and unlabored. Afebrile. . Eyes Nonicteric. Reactive to light. Ears, Nose, Mouth, and Throat Lips, teeth, and gums WNL.Marland Kitchen Moist mucosa without lesions. Neck supple and nontender. No palpable supraclavicular or  cervical adenopathy. Normal sized without goiter. Respiratory WNL. No retractions.. Cardiovascular Pedal Pulses WNL. No clubbing, cyanosis or edema. Lymphatic No adneopathy. No adenopathy. No adenopathy. Musculoskeletal Adexa without tenderness or enlargement.. Digits and nails w/o clubbing, cyanosis, infection, petechiae, ischemia, or inflammatory conditions.. Integumentary (Hair, Skin) No suspicious lesions. No crepitus or fluctuance. No peri-wound warmth or erythema. No masses.Marland Kitchen Psychiatric Judgement and insight  Intact.. No evidence of depression, anxiety, or agitation.. Notes his lymphedema continues and the ulceration on his right lower extremity continues to have a lot of debris and I have not done any sharp debridement today as he has significant amount of pain. The patient has no evidence of cellulitis. Electronic Signature(s) Signed: 11/09/2015 9:40:07 AM By: Evlyn Kanner MD, FACS Entered By: Evlyn Kanner on 11/09/2015 09:40:07 Fernando [name] ([id number]) -------------------------------------------------------------------------------- Physician Orders Details Patient Name: Fernando [name] Date of Service: 11/09/2015 8:45 AM Medical Record Number: [id number] Patient Account Number: [id number] Date of Birth/Sex: [birth year] (60 y.o. Male) Treating RN: Phillis Haggis Primary Care Physician: Rolm Gala Other Clinician: Referring Physician: Rolm Gala Treating Physician/Extender: Rudene Re in Treatment: 1 Verbal / Phone Orders: Yes ClinicianAshok Cordia, Debi Read Back and Verified: Yes Diagnosis Coding Wound Cleansing Wound #1 Right,Anterior Lower Leg o May shower with protection. o No tub bath. Wound #2 Left,Lateral Lower Leg o May shower with protection. o No tub bath. Anesthetic Wound #1 Right,Anterior Lower Leg o Topical Lidocaine 4% cream applied to wound bed prior to debridement - for clinic use Wound #2 Left,Lateral Lower Leg o  Topical Lidocaine 4% cream applied to wound bed prior to debridement - for clinic use Primary Wound Dressing Wound #1 Right,Anterior Lower Leg o Aquacel Ag Wound #2 Left,Lateral Lower Leg o Aquacel Ag Secondary Dressing Wound #1 Right,Anterior Lower Leg o ABD pad o XtraSorb Wound #2 Left,Lateral Lower Leg o ABD pad o XtraSorb Dressing Change Frequency Wound #1 Right,Anterior Lower Leg o Three times weekly - Pt comes to wound care clinic on Mondays [facility] to change on Wednesdays and Fridays Fernando [name], Fernando [name] ([id number]) Wound #2 Left,Lateral Lower Leg o Three times weekly - Pt comes to wound care clinic on Mondays [facility] to change on Wednesdays and Fridays Follow-up Appointments Wound #1 Right,Anterior Lower Leg o Return Appointment in 1 week. Wound #2 Left,Lateral Lower Leg o Return Appointment in 1 week. Edema Control Wound #1 Right,Anterior Lower Leg o 3 Layer Compression System - Bilateral - unna to anchor o Elevate legs to the level of the heart and pump ankles as often as possible Wound #2 Left,Lateral Lower Leg o 3 Layer Compression System - Bilateral - unna to anchor o Elevate legs to the level of the heart and pump ankles as often as possible Additional Orders / Instructions Wound #1 Right,Anterior Lower Leg o Stop Smoking o Increase protein intake. Wound #2 Left,Lateral Lower Leg o Stop Smoking o Increase protein intake. Home Health Wound #1 Right,Anterior Lower Leg o Initiate Home Health for Skilled Nursing o Home Health Nurse may visit PRN to address patientos wound care needs. o FACE TO FACE ENCOUNTER: MEDICARE and MEDICAID PATIENTS: I certify that this patient is under my care and that I had a face-to-face encounter that meets the physician face-to-face encounter requirements with this patient on this date. The encounter with the patient was in whole or in part for the following MEDICAL CONDITION: (primary reason for  Home Healthcare) MEDICAL NECESSITY: I certify, that based on my findings, NURSING services are a medically necessary home health service. HOME BOUND STATUS: I certify that my clinical findings support that this patient is homebound (i.e., Due to illness or injury, pt requires aid of supportive devices such as crutches, cane, wheelchairs, walkers, the use of special transportation or the assistance of another person to leave their place of residence. There is a normal inability to leave the home and doing so requires considerable and taxing effort. Other  absences are for medical reasons / religious services and are infrequent or of short duration when for other reasons). Fernando [name], Fernando [name] ([id number]) o If current dressing causes regression in wound condition, may D/C ordered dressing product/s and apply Normal Saline Moist Dressing daily until next Wound Healing Center / Other MD appointment. Notify Wound Healing Center of regression in wound condition at [phone]. o Please direct any NON-WOUND related issues/requests for orders to patient's Primary Care Physician Wound #2 Left,Lateral Lower Leg o Initiate Home Health for Skilled Nursing o Home Health Nurse may visit PRN to address patientos wound care needs. o FACE TO FACE ENCOUNTER: MEDICARE and MEDICAID PATIENTS: I certify that this patient is under my care and that I had a face-to-face encounter that meets the physician face-to-face encounter requirements with this patient on this date. The encounter with the patient was in whole or in part for the following MEDICAL CONDITION: (primary reason for Home Healthcare) MEDICAL NECESSITY: I certify, that based on my findings, NURSING services are a medically necessary home health service. HOME BOUND STATUS: I certify that my clinical findings support that this patient is homebound (i.e., Due to illness or injury, pt requires aid of supportive devices such as crutches, cane,  wheelchairs, walkers, the use of special transportation or the assistance of another person to leave their place of residence. There is a normal inability to leave the home and doing so requires considerable and taxing effort. Other absences are for medical reasons / religious services and are infrequent or of short duration when for other reasons). o If current dressing causes regression in wound condition, may D/C ordered dressing product/s and apply Normal Saline Moist Dressing daily until next Wound Healing Center / Other MD appointment. Notify Wound Healing Center of regression in wound condition at [phone]. o Please direct any NON-WOUND related issues/requests for orders to patient's Primary Care Physician Medications-please add to medication list. Wound #1 Right,Anterior Lower Leg o Other: - Vitamin A, Vitamin C, Zinc Wound #2 Left,Lateral Lower Leg o Other: - Vitamin A, Vitamin C, Zinc Electronic Signature(s) Signed: 11/09/2015 4:01:52 PM By: Evlyn KannerBritto, Devanta Daniel MD, FACS Signed: 11/09/2015 4:31:50 PM By: Alejandro MullingPinkerton, Debra Entered By: Alejandro MullingPinkerton, Debra on 11/09/2015 09:09:27 Fernando [name], Fernando [name] ([id number]) -------------------------------------------------------------------------------- Problem List Details Patient Name: Fernando [name], Fernando [name] Date of Service: 11/09/2015 8:45 AM Medical Record Number: [id number] Patient Account Number: [id number] Date of Birth/Sex: [birth year](60 y.o. Male) Treating RN: Phillis HaggisPinkerton, Debi Primary Care Physician: Rolm GalaGrandis, Heidi Other Clinician: Referring Physician: Rolm GalaGrandis, Heidi Treating Physician/Extender: Rudene ReBritto, Javante Nilsson Weeks in Treatment: 1 Active Problems ICD-10 Encounter Code Description Active Date Diagnosis E11.622 Type 2 diabetes mellitus with other skin ulcer 11/02/2015 Yes I89.0 Lymphedema, not elsewhere classified 11/02/2015 Yes E66.01 Morbid (severe) obesity due to excess calories 11/02/2015 Yes L97.222 Non-pressure chronic ulcer of left  calf with fat layer 11/02/2015 Yes exposed L97.212 Non-pressure chronic ulcer of right calf with fat layer 11/02/2015 Yes exposed Inactive Problems Resolved Problems Electronic Signature(s) Signed: 11/09/2015 9:38:12 AM By: Evlyn KannerBritto, Collyn Selk MD, FACS Entered By: Evlyn KannerBritto, Torsten Weniger on 11/09/2015 09:38:11 Fernando [name], Fernando [name] ([id number]) -------------------------------------------------------------------------------- Progress Note Details Patient Name: Fernando [name], Fernando [name] Date of Service: 11/09/2015 8:45 AM Medical Record Number: [id number] Patient Account Number: [id number] Date of Birth/Sex: [birth year] [age] y.o. Male) Treating RN: Phillis HaggisPinkerton, Debi Primary Care Physician: Rolm GalaGrandis, Heidi Other Clinician: Referring Physician: Rolm GalaGrandis, Heidi Treating Physician/Extender: Rudene ReBritto, Jacelyn Cuen Weeks in Treatment: 1 Subjective Chief Complaint Information obtained from Patient Patient presents to the wound care center for a consult due non healing wound Both lower extremities with swelling  and has had this for at least 4 years History of Present Illness (HPI) The following HPI elements were documented for the patient's wound: Location: ulcers both lower extremities right worse than left Quality: Patient reports experiencing a dull pain to affected area(s). Severity: Patient states wound are getting worse. Duration: Patient has had the wound for > 3 years prior to seeking treatment at the wound center Timing: Pain in wound is Intermittent (comes and goes Context: The wound appeared gradually over time Modifying Factors: Consults to this date include:wound center at [facility] and cardiology Associated Signs and Symptoms: Patient reports having increase swelling. 60 year old gentleman who has diabetes mellitus, been referred to was for use with lymphedema and peripheral vascular disease. Stand he's had a vascular workup ordered and has a scale opinion done recently. He is a smoker and smokes about 2 packs of cigarettes a  day. past medical history significant for coronary artery disease, CHF, COPD, hyperlipidemia, hypertension, lower extremity edema, obesity, nicotine addiction, status post chronically occluded right coronary artery, history of obstructive sleep apnea treated with a CPAP machine. he is status post hernia repair, tonsillectomy, appendectomy and angioplasty. Note is Made that he has been treated at the wound center for chronic lymphedema with ulceration which improves with Unna's but recurs when he does not have them. hemoglobin A1c was 6.6%. 11/09/2015 -- the patient has not had his vascular tests so far and from what the wife tells me his compression wraps have been slipping and they need to be changed more frequently. He has several excuses for not stopping to smoke and we have address this again. Objective Fernando [name], Fernando [name] ([id number]) Constitutional Pulse regular. Respirations normal and unlabored. Afebrile. Vitals Time Taken: 8:46 AM, Height: 64 in, Weight: 291 lbs, BMI: 49.9, Temperature: 97.8 F, Pulse: 75 bpm, Respiratory Rate: 22 breaths/min, Blood Pressure: 120/60 mmHg. Eyes Nonicteric. Reactive to light. Ears, Nose, Mouth, and Throat Lips, teeth, and gums WNL.Marland Kitchen Moist mucosa without lesions. Neck supple and nontender. No palpable supraclavicular or cervical adenopathy. Normal sized without goiter. Respiratory WNL. No retractions.. Cardiovascular Pedal Pulses WNL. No clubbing, cyanosis or edema. Lymphatic No adneopathy. No adenopathy. No adenopathy. Musculoskeletal Adexa without tenderness or enlargement.. Digits and nails w/o clubbing, cyanosis, infection, petechiae, ischemia, or inflammatory conditions.Marland Kitchen Psychiatric Judgement and insight Intact.. No evidence of depression, anxiety, or agitation.. General Notes: his lymphedema continues and the ulceration on his right lower extremity continues to have a lot of debris and I have not done any sharp debridement today as he has  significant amount of pain. The patient has no evidence of cellulitis. Integumentary (Hair, Skin) No suspicious lesions. No crepitus or fluctuance. No peri-wound warmth or erythema. No masses.. Wound #1 status is Open. Original cause of wound was Gradually Appeared. The wound is located on the Right,Anterior Lower Leg. The wound measures 10.7cm length x 10.2cm width x 0.3cm depth; 85.718cm^2 area and 25.716cm^3 volume. The wound is limited to skin breakdown. There is no tunneling or undermining noted. There is a large amount of serous drainage noted. The wound margin is distinct with the outline attached to the wound base. There is no granulation within the wound bed. There is a large (67- 100%) amount of necrotic tissue within the wound bed including Adherent Slough. The periwound skin appearance exhibited: Localized Edema, Moist, Erythema. The surrounding wound skin color is noted with erythema which is circumferential. Periwound temperature was noted as No Abnormality. The periwound has tenderness on palpation. Fernando [name], Fernando [name] ([id number]) Wound #2 status is Open.  Original cause of wound was Gradually Appeared. The wound is located on the Left,Lateral Lower Leg. The wound measures 3.6cm length x 6.5cm width x 0.1cm depth; 18.378cm^2 area and 1.838cm^3 volume. The wound is limited to skin breakdown. There is no tunneling or undermining noted. There is a large amount of serous drainage noted. The wound margin is distinct with the outline attached to the wound base. There is no granulation within the wound bed. There is a large (67-100%) amount of necrotic tissue within the wound bed including Adherent Slough. The periwound skin appearance exhibited: Localized Edema, Moist. Periwound temperature was noted as No Abnormality. The periwound has tenderness on palpation. Assessment Active Problems ICD-10 E11.622 - Type 2 diabetes mellitus with other skin ulcer I89.0 - Lymphedema, not elsewhere  classified E66.01 - Morbid (severe) obesity due to excess calories L97.222 - Non-pressure chronic ulcer of left calf with fat layer exposed L97.212 - Non-pressure chronic ulcer of right calf with fat layer exposed Plan Wound Cleansing: Wound #1 Right,Anterior Lower Leg: May shower with protection. No tub bath. Wound #2 Left,Lateral Lower Leg: May shower with protection. No tub bath. Anesthetic: Wound #1 Right,Anterior Lower Leg: Topical Lidocaine 4% cream applied to wound bed prior to debridement - for clinic use Wound #2 Left,Lateral Lower Leg: Topical Lidocaine 4% cream applied to wound bed prior to debridement - for clinic use Primary Wound Dressing: Wound #1 Right,Anterior Lower Leg: Aquacel Ag Wound #2 Left,Lateral Lower Leg: Aquacel Ag Secondary Dressing: Wound #1 Right,Anterior Lower Leg: ABD pad Fernando [name] ([id number]) XtraSorb Wound #2 Left,Lateral Lower Leg: ABD pad XtraSorb Dressing Change Frequency: Wound #1 Right,Anterior Lower Leg: Three times weekly - Pt comes to wound care clinic on Mondays [facility] to change on Wednesdays and Fridays Wound #2 Left,Lateral Lower Leg: Three times weekly - Pt comes to wound care clinic on Mondays [facility] to change on Wednesdays and Fridays Follow-up Appointments: Wound #1 Right,Anterior Lower Leg: Return Appointment in 1 week. Wound #2 Left,Lateral Lower Leg: Return Appointment in 1 week. Edema Control: Wound #1 Right,Anterior Lower Leg: 3 Layer Compression System - Bilateral - unna to anchor Elevate legs to the level of the heart and pump ankles as often as possible Wound #2 Left,Lateral Lower Leg: 3 Layer Compression System - Bilateral - unna to anchor Elevate legs to the level of the heart and pump ankles as often as possible Additional Orders / Instructions: Wound #1 Right,Anterior Lower Leg: Stop Smoking Increase protein intake. Wound #2 Left,Lateral Lower Leg: Stop Smoking Increase protein intake. Home  Health: Wound #1 Right,Anterior Lower Leg: Initiate Home Health for Skilled Nursing Home Health Nurse may visit PRN to address patient s wound care needs. FACE TO FACE ENCOUNTER: MEDICARE and MEDICAID PATIENTS: I certify that this patient is under my care and that I had a face-to-face encounter that meets the physician face-to-face encounter requirements with this patient on this date. The encounter with the patient was in whole or in part for the following MEDICAL CONDITION: (primary reason for Home Healthcare) MEDICAL NECESSITY: I certify, that based on my findings, NURSING services are a medically necessary home health service. HOME BOUND STATUS: I certify that my clinical findings support that this patient is homebound (i.e., Due to illness or injury, pt requires aid of supportive devices such as crutches, cane, wheelchairs, walkers, the use of special transportation or the assistance of another person to leave their place of residence. There is a normal inability to leave the home and doing so requires considerable and  taxing effort. Other absences are for medical reasons / religious services and are infrequent or of short duration when for other reasons). If current dressing causes regression in wound condition, may D/C ordered dressing product/s and apply Normal Saline Moist Dressing daily until next Wound Healing Center / Other MD appointment. Notify Wound Healing Center of regression in wound condition at [phone]. Please direct any NON-WOUND related issues/requests for orders to patient's Primary Care Physician Wound #2 Left,Lateral Lower Leg: Initiate Home Health for Skilled Nursing Home Health Nurse may visit PRN to address patient s wound care needs. Fernando [name], Fernando [name] ([id number]) FACE TO FACE ENCOUNTER: MEDICARE and MEDICAID PATIENTS: I certify that this patient is under my care and that I had a face-to-face encounter that meets the physician face-to-face  encounter requirements with this patient on this date. The encounter with the patient was in whole or in part for the following MEDICAL CONDITION: (primary reason for Home Healthcare) MEDICAL NECESSITY: I certify, that based on my findings, NURSING services are a medically necessary home health service. HOME BOUND STATUS: I certify that my clinical findings support that this patient is homebound (i.e., Due to illness or injury, pt requires aid of supportive devices such as crutches, cane, wheelchairs, walkers, the use of special transportation or the assistance of another person to leave their place of residence. There is a normal inability to leave the home and doing so requires considerable and taxing effort. Other absences are for medical reasons / religious services and are infrequent or of short duration when for other reasons). If current dressing causes regression in wound condition, may D/C ordered dressing product/s and apply Normal Saline Moist Dressing daily until next Wound Healing Center / Other MD appointment. Notify Wound Healing Center of regression in wound condition at [phone]. Please direct any NON-WOUND related issues/requests for orders to patient's Primary Care Physician Medications-please add to medication list.: Wound #1 Right,Anterior Lower Leg: Other: - Vitamin A, Vitamin C, Zinc Wound #2 Left,Lateral Lower Leg: Other: - Vitamin A, Vitamin C, Zinc I have recommended: 1. silver alginate and a Profore 3 layer compression.we will have home health come and change this for a total of 3 times a week. 2. Elevation and exercise 3. completly giving up smoking and I have spent time discussing this again. He is vehemently opposed to giving up smoking. 4. arterial and venous duplex studies have already been scheduled -- appointment still pending. 5. Control of his diabetes mellitus and CHF 6. regular visits to the wound center Electronic Signature(s) Signed: 11/09/2015  9:41:22 AM By: Evlyn Kanner MD, FACS Entered By: Evlyn Kanner on 11/09/2015 09:41:22 Fernando [name] ([id number]) -------------------------------------------------------------------------------- SuperBill Details Patient Name: Fernando [name] Date of Service: 11/09/2015 Medical Record Number: [id number] Patient Account Number: [id number] Date of Birth/Sex: 10/13/1955 (60 y.o. Male) Treating RN: Phillis Haggis Primary Care Physician: Rolm Gala Other Clinician: Referring Physician: Rolm Gala Treating Physician/Extender: Rudene Re in Treatment: 1 Diagnosis Coding ICD-10 Codes Code Description E11.622 Type 2 diabetes mellitus with other skin ulcer I89.0 Lymphedema, not elsewhere classified E66.01 Morbid (severe) obesity due to excess calories L97.222 Non-pressure chronic ulcer of left calf with fat layer exposed L97.212 Non-pressure chronic ulcer of right calf with fat layer exposed Facility Procedures CPT4: Description Modifier Quantity Code [id number] BILATERAL: Application of multi-layer venous compression 1 system; leg (below knee), including ankle and foot. Physician Procedures CPT4 Code: 3664403 Description: 99213 - WC PHYS LEVEL 3 - EST PT ICD-10 Description Diagnosis E11.622 Type 2 diabetes mellitus with other  skin ulcer I89.0 Lymphedema, not elsewhere classified L97.212 Non-pressure chronic ulcer of right calf with fat L97.222 Non-pressure  chronic ulcer of left calf with fat Modifier: layer exposed layer exposed Quantity: 1 Electronic Signature(s) Signed: 11/09/2015 4:01:52 PM By: Evlyn KannerBritto, Draken Farrior MD, FACS Signed: 11/09/2015 4:31:50 PM By: Alejandro MullingPinkerton, Debra Previous Signature: 11/09/2015 9:41:38 AM Version By: Evlyn KannerBritto, Anastaisa Wooding MD, FACS Entered By: Alejandro MullingPinkerton, Debra on 11/09/2015 12:05:29

## 2015-11-09 NOTE — Progress Notes (Signed)
Fernando [name], Elmo ([id number]) Visit Report for 11/09/2015 Arrival Information Details Patient Name: Fernando [name], Fernando [name] Date of Service: 11/09/2015 8:45 AM Medical Record Number: [id number] Patient Account Number: [id number] Date of Birth/Sex: 1955/01/28 36(60 y.o. Male) Treating RN: Phillis HaggisPinkerton, Debi Primary Care Physician: Rolm GalaGrandis, Heidi Other Clinician: Referring Physician: Rolm GalaGrandis, Heidi Treating Physician/Extender: Rudene ReBritto, Errol Weeks in Treatment: 1 Visit Information History Since Last Visit All ordered tests and consults were completed: No Patient Arrived: Ambulatory Added or deleted any medications: No Arrival Time: 08:44 Any new allergies or adverse reactions: No Accompanied By: wife Had a fall or experienced change in No Transfer Assistance: None activities of daily living that may affect Patient Identification Verified: Yes risk of falls: Secondary Verification Process Yes Signs or symptoms of abuse/neglect since last No Completed: visito Patient Has Alerts: Yes Hospitalized since last visit: No Patient Alerts: DMII Pain Present Now: No ABI Blackwell RIGHT >220 Electronic Signature(s) Signed: 11/09/2015 4:31:50 PM By: Alejandro MullingPinkerton, Debra Entered By: Alejandro MullingPinkerton, Debra on 11/09/2015 08:45:41 Fernando [name], Fernando [name] ([id number]) -------------------------------------------------------------------------------- Encounter Discharge Information Details Patient Name: Fernando [name], Fernando [name] Date of Service: 11/09/2015 8:45 AM Medical Record Number: [id number] Patient Account Number: [id number] Date of Birth/Sex: 1955/01/28 [age] y.o. Male) Treating RN: Phillis HaggisPinkerton, Debi Primary Care Physician: Rolm GalaGrandis, Heidi Other Clinician: Referring Physician: Rolm GalaGrandis, Heidi Treating Physician/Extender: Rudene ReBritto, Errol Weeks in Treatment: 1 Encounter Discharge Information Items Discharge Pain Level: 0 Discharge Condition: Stable Ambulatory Status: Ambulatory Discharge Destination: Home Transportation: Private  Auto Accompanied By: wife Schedule Follow-up Appointment: Yes Medication Reconciliation completed and provided to Patient/Care Yes Abhishek Levesque: Provided on Clinical Summary of Care: 11/09/2015 Form Type Recipient Paper Patient SK Electronic Signature(s) Signed: 11/09/2015 9:38:13 AM By: Gwenlyn PerkingMoore, Shelia Entered By: Gwenlyn PerkingMoore, Shelia on 11/09/2015 09:38:13 Fernando [name], Fernando [name] ([id number]) -------------------------------------------------------------------------------- Lower Extremity Assessment Details Patient Name: Fernando [name], Fernando [name] Date of Service: 11/09/2015 8:45 AM Medical Record Number: [id number] Patient Account Number: [id number] Date of Birth/Sex: 1955/01/28 49(60 y.o. Male) Treating RN: Phillis HaggisPinkerton, Debi Primary Care Physician: Rolm GalaGrandis, Heidi Other Clinician: Referring Physician: Rolm GalaGrandis, Heidi Treating Physician/Extender: Rudene ReBritto, Errol Weeks in Treatment: 1 Edema Assessment Assessed: [Left: No] [Right: No] E[Left: dema] [Right: :] Calf Left: Right: Point of Measurement: 35 cm From Medial Instep 48.2 cm 50.5 cm Ankle Left: Right: Point of Measurement: 10 cm From Medial Instep 26.5 cm 26.3 cm Vascular Assessment Pulses: Posterior Tibial Dorsalis Pedis Palpable: [Left:Yes] [Right:Yes] Extremity colors, hair growth, and conditions: Extremity Color: [Left:Hyperpigmented] [Right:Hyperpigmented] Temperature of Extremity: [Left:Warm] [Right:Warm] Capillary Refill: [Left:> 3 seconds] [Right:< 3 seconds] Toe Nail Assessment Left: Right: Thick: Yes Yes Discolored: Yes Yes Deformed: Yes Yes Improper Length and Hygiene: Yes Yes Electronic Signature(s) Signed: 11/09/2015 4:31:50 PM By: Alejandro MullingPinkerton, Debra Entered By: Alejandro MullingPinkerton, Debra on 11/09/2015 08:50:15 Fernando [name], Fernando [name] ([id number]) -------------------------------------------------------------------------------- Multi Wound Chart Details Patient Name: Fernando [name], Fernando [name] Date of Service: 11/09/2015 8:45 AM Medical Record Number:  [id number] Patient Account Number: [id number] Date of Birth/Sex: 1955/01/28 14(60 y.o. Male) Treating RN: Phillis HaggisPinkerton, Debi Primary Care Physician: Rolm GalaGrandis, Heidi Other Clinician: Referring Physician: Rolm GalaGrandis, Heidi Treating Physician/Extender: Rudene ReBritto, Errol Weeks in Treatment: 1 Vital Signs Height(in): 64 Pulse(bpm): 75 Weight(lbs): 291 Blood Pressure 120/60 (mmHg): Body Mass Index(BMI): 50 Temperature(F): 97.8 Respiratory Rate 22 (breaths/min): Photos: [1:No Photos] [2:No Photos] [N/A:N/A] Wound Location: [1:Right Lower Leg - Anterior Left Lower Leg - Lateral] [N/A:N/A] Wounding Event: [1:Gradually Appeared] [2:Gradually Appeared] [N/A:N/A] Primary Etiology: [1:Lymphedema] [2:Lymphedema] [N/A:N/A] Secondary Etiology: [1:Venous Leg Ulcer] [2:Venous Leg Ulcer] [N/A:N/A] Comorbid History: [1:Lymphedema, Chronic Obstructive Pulmonary Disease (COPD), Sleep Disease (COPD), Sleep Apnea, Congestive Heart Apnea, Congestive Heart Failure, Hypertension, Peripheral Arterial Disease, Type  II Diabetes Disease, Type II Diabetes]  [2:Lymphedema, Chronic Obstructive Pulmonary Failure, Hypertension, Peripheral Arterial] [N/A:N/A] Date Acquired: [1:07/03/2013] [2:07/03/2013] [N/A:N/A] Weeks of Treatment: [1:1] [2:1] [N/A:N/A] Wound Status: [1:Open] [2:Open] [N/A:N/A] Measurements L x W x D 10.7x10.2x0.3 [2:3.6x6.5x0.1] [N/A:N/A] (cm) Area (cm) : [1:85.718] [2:18.378] [N/A:N/A] Volume (cm) : [1:25.716] [2:1.838] [N/A:N/A] % Reduction in Area: [1:-138.20%] [2:28.00%] [N/A:N/A] % Reduction in Volume: -257.30% [2:28.00%] [N/A:N/A] Classification: [1:Full Thickness Without Exposed Support Structures] [2:Partial Thickness] [N/A:N/A] HBO Classification: [1:Grade 1] [2:Grade 1] [N/A:N/A] Exudate Amount: [1:Large] [2:Large] [N/A:N/A] Exudate Type: [1:Serous] [2:Serous] [N/A:N/A] Exudate Color: [1:amber] [2:amber] [N/A:N/A] Wound Margin: [1:Distinct, outline attached Distinct, outline attached]  [N/A:N/A] Granulation Amount: [1:None Present (0%)] [2:None Present (0%)] [N/A:N/A] Necrotic Amount: Large (67-100%) Large (67-100%) N/A Exposed Structures: Fascia: No Fascia: No N/A Fat: No Fat: No Tendon: No Tendon: No Muscle: No Muscle: No Joint: No Joint: No Bone: No Bone: No Limited to Skin Limited to Skin Breakdown Breakdown Epithelialization: None None N/A Periwound Skin Texture: Edema: Yes Edema: Yes N/A Periwound Skin Moist: Yes Moist: Yes N/A Moisture: Periwound Skin Color: Erythema: Yes No Abnormalities Noted N/A Erythema Location: Circumferential N/A N/A Temperature: No Abnormality No Abnormality N/A Tenderness on Yes Yes N/A Palpation: Wound Preparation: Ulcer Cleansing: Ulcer Cleansing: N/A Rinsed/Irrigated with Rinsed/Irrigated with Saline, Other: soap and Saline, Other: soap and water water Topical Anesthetic Topical Anesthetic Applied: Other: lidocaine Applied: Other: lidocaine 4% 4% Treatment Notes Electronic Signature(s) Signed: 11/09/2015 4:31:50 PM By: Alejandro Mulling Entered By: Alejandro Mulling on 11/09/2015 08:55:39 Fernando Mannan ([id number]) -------------------------------------------------------------------------------- Multi-Disciplinary Care Plan Details Patient Name: Fernando Mannan Date of Service: 11/09/2015 8:45 AM Medical Record Number: [id number] Patient Account Number: [id number] Date of Birth/Sex: 01-02-1956 (60 y.o. Male) Treating RN: Phillis Haggis Primary Care Physician: Rolm Gala Other Clinician: Referring Physician: Rolm Gala Treating Physician/Extender: Rudene Re in Treatment: 1 Active Inactive Abuse / Safety / Falls / Self Care Management Nursing Diagnoses: Impaired physical mobility Potential for falls Goals: Patient will remain injury free Date Initiated: 11/02/2015 Goal Status: Active Interventions: Assess fall risk on admission and as needed Notes: Orientation to the Wound Care  Program Nursing Diagnoses: Knowledge deficit related to the wound healing center program Goals: Patient/caregiver will verbalize understanding of the Wound Healing Center Program Date Initiated: 11/02/2015 Goal Status: Active Interventions: Provide education on orientation to the wound center Notes: Wound/Skin Impairment Nursing Diagnoses: Impaired tissue integrity Goals: Patient/caregiver will verbalize understanding of skin care regimen ESSIE, GEHRET ([id number]) Date Initiated: 11/02/2015 Goal Status: Active Ulcer/skin breakdown will have a volume reduction of 30% by week 4 Date Initiated: 11/02/2015 Goal Status: Active Ulcer/skin breakdown will have a volume reduction of 50% by week 8 Date Initiated: 11/02/2015 Goal Status: Active Ulcer/skin breakdown will have a volume reduction of 80% by week 12 Date Initiated: 11/02/2015 Goal Status: Active Ulcer/skin breakdown will heal within 14 weeks Date Initiated: 11/02/2015 Goal Status: Active Interventions: Assess patient/caregiver ability to obtain necessary supplies Assess patient/caregiver ability to perform ulcer/skin care regimen upon admission and as needed Assess ulceration(s) every visit Notes: Electronic Signature(s) Signed: 11/09/2015 4:31:50 PM By: Alejandro Mulling Entered By: Alejandro Mulling on 11/09/2015 08:55:34 Fernando Mannan ([id number]) -------------------------------------------------------------------------------- Pain Assessment Details Patient Name: Fernando Mannan Date of Service: 11/09/2015 8:45 AM Medical Record Number: [id number] Patient Account Number: [id number] Date of Birth/Sex: 1955-07-28 (60 y.o. Male) Treating RN: Phillis Haggis Primary Care Physician: Rolm Gala Other Clinician: Referring Physician: Rolm Gala Treating Physician/Extender: Rudene Re in Treatment: 1 Active Problems Location of Pain Severity and Description of Pain Patient  Has Paino No Site  Locations With Dressing Change: No Pain Management and Medication Current Pain Management: Electronic Signature(s) Signed: 11/09/2015 4:31:50 PM By: Alejandro MullingPinkerton, Debra Entered By: Alejandro MullingPinkerton, Debra on 11/09/2015 08:46:15 Fernando [name], Cam ([id number]) -------------------------------------------------------------------------------- Patient/Caregiver Education Details Patient Name: Fernando [name], Fernando [name] Date of Service: 11/09/2015 8:45 AM Medical Record Number: [id number] Patient Account Number: [id number] Date of Birth/Gender: 1955/10/10 [age] y.o. Male) Treating RN: Phillis HaggisPinkerton, Debi Primary Care Physician: Rolm GalaGrandis, Heidi Other Clinician: Referring Physician: Rolm GalaGrandis, Heidi Treating Physician/Extender: Rudene ReBritto, Errol Weeks in Treatment: 1 Education Assessment Education Provided To: Patient Education Topics Provided Wound/Skin Impairment: Handouts: Other: change dressing as ordered, do not get wraps wet Methods: Demonstration, Explain/Verbal Responses: State content correctly Electronic Signature(s) Signed: 11/09/2015 4:31:50 PM By: Alejandro MullingPinkerton, Debra Entered By: Alejandro MullingPinkerton, Debra on 11/09/2015 09:10:43 Fernando [name], Fernando [name] ([id number]) -------------------------------------------------------------------------------- Wound Assessment Details Patient Name: Fernando [name], Allister Date of Service: 11/09/2015 8:45 AM Medical Record Number: [id number] Patient Account Number: [id number] Date of Birth/Sex: 1955/10/10 [age] y.o. Male) Treating RN: Phillis HaggisPinkerton, Debi Primary Care Physician: Rolm GalaGrandis, Heidi Other Clinician: Referring Physician: Rolm GalaGrandis, Heidi Treating Physician/Extender: Rudene ReBritto, Errol Weeks in Treatment: 1 Wound Status Wound Number: 1 Primary Lymphedema Etiology: Wound Location: Right Lower Leg - Anterior Secondary Venous Leg Ulcer Wounding Event: Gradually Appeared Etiology: Date Acquired: 07/03/2013 Wound Open Weeks Of Treatment: 1 Status: Clustered Wound: No Comorbid Lymphedema, Chronic  Obstructive History: Pulmonary Disease (COPD), Sleep Apnea, Congestive Heart Failure, Hypertension, Peripheral Arterial Disease, Type II Diabetes Photos Photo Uploaded By: Alejandro MullingPinkerton, Debra on 11/09/2015 09:44:58 Wound Measurements Length: (cm) 10.7 Width: (cm) 10.2 Depth: (cm) 0.3 Area: (cm) 85.718 Volume: (cm) 25.716 % Reduction in Area: -138.2% % Reduction in Volume: -257.3% Epithelialization: None Tunneling: No Undermining: No Wound Description Full Thickness Without Classification: Exposed Support Structures Diabetic Severity Grade 1 (Wagner): Wound Margin: Distinct, outline attached Exudate Amount: Large Exudate Type: Serous Fernando [name], Jedrek ([id number]) Foul Odor After Cleansing: No Exudate Color: amber Wound Bed Granulation Amount: None Present (0%) Exposed Structure Necrotic Amount: Large (67-100%) Fascia Exposed: No Necrotic Quality: Adherent Slough Fat Layer Exposed: No Tendon Exposed: No Muscle Exposed: No Joint Exposed: No Bone Exposed: No Limited to Skin Breakdown Periwound Skin Texture Texture Color No Abnormalities Noted: No No Abnormalities Noted: No Localized Edema: Yes Erythema: Yes Erythema Location: Circumferential Moisture No Abnormalities Noted: No Temperature / Pain Moist: Yes Temperature: No Abnormality Tenderness on Palpation: Yes Wound Preparation Ulcer Cleansing: Rinsed/Irrigated with Saline, Other: soap and water, Topical Anesthetic Applied: Other: lidocaine 4%, Treatment Notes Wound #1 (Right, Anterior Lower Leg) 1. Cleansed with: Clean wound with Normal Saline Cleanse wound with antibacterial soap and water 2. Anesthetic Topical Lidocaine 4% cream to wound bed prior to debridement 4. Dressing Applied: Aquacel Ag 5. Secondary Dressing Applied ABD Pad 7. Secured with Tape 3 Layer Compression System - Bilateral Notes unna to anchor Electronic Signature(s) Signed: 11/09/2015 4:31:50 PM By: Alejandro MullingPinkerton,  Debra Entered By: Alejandro MullingPinkerton, Debra on 11/09/2015 08:55:27 Fernando [name], Jon ([id number]) Fernando [name], Tremar ([id number]) -------------------------------------------------------------------------------- Wound Assessment Details Patient Name: Fernando [name], Samson Date of Service: 11/09/2015 8:45 AM Medical Record Number: [id number] Patient Account Number: [id number] Date of Birth/Sex: 1955/10/10 [age] y.o. Male) Treating RN: Phillis HaggisPinkerton, Debi Primary Care Physician: Rolm GalaGrandis, Heidi Other Clinician: Referring Physician: Rolm GalaGrandis, Heidi Treating Physician/Extender: Rudene ReBritto, Errol Weeks in Treatment: 1 Wound Status Wound Number: 2 Primary Lymphedema Etiology: Wound Location: Left Lower Leg - Lateral Secondary Venous Leg Ulcer Wounding Event: Gradually Appeared Etiology: Date Acquired: 07/03/2013 Wound Open Weeks Of Treatment: 1 Status: Clustered Wound: No Comorbid Lymphedema, Chronic Obstructive History: Pulmonary Disease (COPD), Sleep  Apnea, Congestive Heart Failure, Hypertension, Peripheral Arterial Disease, Type II Diabetes Photos Photo Uploaded By: Alejandro Mulling on 11/09/2015 09:44:59 Wound Measurements Length: (cm) 3.6 Width: (cm) 6.5 Depth: (cm) 0.1 Area: (cm) 18.378 Volume: (cm) 1.838 % Reduction in Area: 28% % Reduction in Volume: 28% Epithelialization: None Tunneling: No Undermining: No Wound Description Classification: Partial Thickness Foul O Diabetic Severity (Wagner): Grade 1 Wound Margin: Distinct, outline attached Exudate Amount: Large Exudate Type: Serous Exudate Color: amber KIERNAN, ATKERSON ([id number]) dor After Cleansing: No Wound Bed Granulation Amount: None Present (0%) Exposed Structure Necrotic Amount: Large (67-100%) Fascia Exposed: No Necrotic Quality: Adherent Slough Fat Layer Exposed: No Tendon Exposed: No Muscle Exposed: No Joint Exposed: No Bone Exposed: No Limited to Skin Breakdown Periwound Skin Texture Texture Color No Abnormalities  Noted: No No Abnormalities Noted: No Localized Edema: Yes Temperature / Pain Moisture Temperature: No Abnormality No Abnormalities Noted: No Tenderness on Palpation: Yes Moist: Yes Wound Preparation Ulcer Cleansing: Rinsed/Irrigated with Saline, Other: soap and water, Topical Anesthetic Applied: Other: lidocaine 4%, Treatment Notes Wound #2 (Left, Lateral Lower Leg) 1. Cleansed with: Clean wound with Normal Saline Cleanse wound with antibacterial soap and water 2. Anesthetic Topical Lidocaine 4% cream to wound bed prior to debridement 4. Dressing Applied: Aquacel Ag 5. Secondary Dressing Applied ABD Pad 7. Secured with Tape 3 Layer Compression System - Bilateral Notes unna to anchor Electronic Signature(s) Signed: 11/09/2015 4:31:50 PM By: Alejandro Mulling Entered By: Alejandro Mulling on 11/09/2015 08:53:18 Fernando Mannan ([id number]) -------------------------------------------------------------------------------- Vitals Details Patient Name: Fernando Mannan Date of Service: 11/09/2015 8:45 AM Medical Record Number: [id number] Patient Account Number: [id number] Date of Birth/Sex: December 21, 1955 (60 y.o. Male) Treating RN: Phillis Haggis Primary Care Physician: Rolm Gala Other Clinician: Referring Physician: Rolm Gala Treating Physician/Extender: Rudene Re in Treatment: 1 Vital Signs Time Taken: 08:46 Temperature (F): 97.8 Height (in): 64 Pulse (bpm): 75 Weight (lbs): 291 Respiratory Rate (breaths/min): 22 Body Mass Index (BMI): 49.9 Blood Pressure (mmHg): 120/60 Reference Range: 80 - 120 mg / dl Electronic Signature(s) Signed: 11/09/2015 4:31:50 PM By: Alejandro Mulling Entered By: Alejandro Mulling on 11/09/2015 08:46:38

## 2015-11-12 DIAGNOSIS — Z6841 Body Mass Index (BMI) 40.0 and over, adult: Secondary | ICD-10-CM | POA: Diagnosis not present

## 2015-11-12 DIAGNOSIS — L97211 Non-pressure chronic ulcer of right calf limited to breakdown of skin: Secondary | ICD-10-CM | POA: Diagnosis not present

## 2015-11-12 DIAGNOSIS — E11622 Type 2 diabetes mellitus with other skin ulcer: Secondary | ICD-10-CM | POA: Diagnosis not present

## 2015-11-12 DIAGNOSIS — E785 Hyperlipidemia, unspecified: Secondary | ICD-10-CM | POA: Diagnosis not present

## 2015-11-12 DIAGNOSIS — I2510 Atherosclerotic heart disease of native coronary artery without angina pectoris: Secondary | ICD-10-CM | POA: Diagnosis not present

## 2015-11-12 DIAGNOSIS — L97221 Non-pressure chronic ulcer of left calf limited to breakdown of skin: Secondary | ICD-10-CM | POA: Diagnosis not present

## 2015-11-12 DIAGNOSIS — I509 Heart failure, unspecified: Secondary | ICD-10-CM | POA: Diagnosis not present

## 2015-11-12 DIAGNOSIS — I2582 Chronic total occlusion of coronary artery: Secondary | ICD-10-CM | POA: Diagnosis not present

## 2015-11-12 DIAGNOSIS — E1151 Type 2 diabetes mellitus with diabetic peripheral angiopathy without gangrene: Secondary | ICD-10-CM | POA: Diagnosis not present

## 2015-11-12 DIAGNOSIS — J449 Chronic obstructive pulmonary disease, unspecified: Secondary | ICD-10-CM | POA: Diagnosis not present

## 2015-11-12 DIAGNOSIS — I890 Lymphedema, not elsewhere classified: Secondary | ICD-10-CM | POA: Diagnosis not present

## 2015-11-12 DIAGNOSIS — I110 Hypertensive heart disease with heart failure: Secondary | ICD-10-CM | POA: Diagnosis not present

## 2015-11-14 DIAGNOSIS — I110 Hypertensive heart disease with heart failure: Secondary | ICD-10-CM | POA: Diagnosis not present

## 2015-11-14 DIAGNOSIS — E11622 Type 2 diabetes mellitus with other skin ulcer: Secondary | ICD-10-CM | POA: Diagnosis not present

## 2015-11-14 DIAGNOSIS — I890 Lymphedema, not elsewhere classified: Secondary | ICD-10-CM | POA: Diagnosis not present

## 2015-11-14 DIAGNOSIS — E785 Hyperlipidemia, unspecified: Secondary | ICD-10-CM | POA: Diagnosis not present

## 2015-11-14 DIAGNOSIS — L97221 Non-pressure chronic ulcer of left calf limited to breakdown of skin: Secondary | ICD-10-CM | POA: Diagnosis not present

## 2015-11-14 DIAGNOSIS — J449 Chronic obstructive pulmonary disease, unspecified: Secondary | ICD-10-CM | POA: Diagnosis not present

## 2015-11-14 DIAGNOSIS — Z6841 Body Mass Index (BMI) 40.0 and over, adult: Secondary | ICD-10-CM | POA: Diagnosis not present

## 2015-11-14 DIAGNOSIS — I2510 Atherosclerotic heart disease of native coronary artery without angina pectoris: Secondary | ICD-10-CM | POA: Diagnosis not present

## 2015-11-14 DIAGNOSIS — L97211 Non-pressure chronic ulcer of right calf limited to breakdown of skin: Secondary | ICD-10-CM | POA: Diagnosis not present

## 2015-11-14 DIAGNOSIS — I2582 Chronic total occlusion of coronary artery: Secondary | ICD-10-CM | POA: Diagnosis not present

## 2015-11-14 DIAGNOSIS — I509 Heart failure, unspecified: Secondary | ICD-10-CM | POA: Diagnosis not present

## 2015-11-14 DIAGNOSIS — E1151 Type 2 diabetes mellitus with diabetic peripheral angiopathy without gangrene: Secondary | ICD-10-CM | POA: Diagnosis not present

## 2015-11-16 ENCOUNTER — Encounter: Payer: PPO | Admitting: Surgery

## 2015-11-16 DIAGNOSIS — L97812 Non-pressure chronic ulcer of other part of right lower leg with fat layer exposed: Secondary | ICD-10-CM | POA: Diagnosis not present

## 2015-11-16 DIAGNOSIS — I890 Lymphedema, not elsewhere classified: Secondary | ICD-10-CM | POA: Diagnosis not present

## 2015-11-16 DIAGNOSIS — E11622 Type 2 diabetes mellitus with other skin ulcer: Secondary | ICD-10-CM | POA: Diagnosis not present

## 2015-11-16 DIAGNOSIS — L97821 Non-pressure chronic ulcer of other part of left lower leg limited to breakdown of skin: Secondary | ICD-10-CM | POA: Diagnosis not present

## 2015-11-17 NOTE — Progress Notes (Signed)
BAZIL, DHANANI ([id number]) Visit Report for 11/16/2015 Arrival Information Details Patient Name: Fernando [name], Fernando [name] Date of Service: 11/16/2015 8:45 AM Medical Record Number: [id number] Patient Account Number: [id number] Date of Birth/Sex: 07-29-1955 (60 y.o. Male) Treating RN: Phillis Haggis Primary Care Physician: Rolm Gala Other Clinician: Referring Physician: Rolm Gala Treating Physician/Extender: Rudene Re in Treatment: 2 Visit Information History Since Last Visit All ordered tests and consults were completed: No Patient Arrived: Ambulatory Added or deleted any medications: No Arrival Time: 08:59 Any new allergies or adverse reactions: No Accompanied By: wife Had a fall or experienced change in No Transfer Assistance: None activities of daily living that may affect Patient Identification Verified: Yes risk of falls: Secondary Verification Process Yes Signs or symptoms of abuse/neglect since last No Completed: visito Patient Requires Transmission- No Hospitalized since last visit: No Based Precautions: Pain Present Now: No Patient Has Alerts: Yes Patient Alerts: DMII ABI [place] RIGHT >220 Electronic Signature(s) Signed: 11/16/2015 5:02:43 PM By: Alejandro Mulling Entered By: Alejandro Mulling on 11/16/2015 09:00:05 Fernando [name] ([id number]) -------------------------------------------------------------------------------- Encounter Discharge Information Details Patient Name: Fernando [name] Date of Service: 11/16/2015 8:45 AM Medical Record Number: [id number] Patient Account Number: [id number] Date of Birth/Sex: 1955-09-25 (60 y.o. Male) Treating RN: Phillis Haggis Primary Care Physician: Rolm Gala Other Clinician: Referring Physician: Rolm Gala Treating Physician/Extender: Rudene Re in Treatment: 2 Encounter Discharge Information Items Discharge Pain Level: 0 Discharge Condition: Stable Ambulatory Status:  Ambulatory Discharge Destination: Home Transportation: Private Auto Accompanied By: wife Schedule Follow-up Appointment: Yes Medication Reconciliation completed and provided to Patient/Care Yes Lynnann Knudsen: Provided on Clinical Summary of Care: 11/16/2015 Form Type Recipient Paper Patient SK Electronic Signature(s) Signed: 11/16/2015 9:56:44 AM By: Gwenlyn Perking Entered By: Gwenlyn Perking on 11/16/2015 09:56:44 Fernando [name] ([id number]) -------------------------------------------------------------------------------- Lower Extremity Assessment Details Patient Name: Fernando [name] Date of Service: 11/16/2015 8:45 AM Medical Record Number: [id number] Patient Account Number: [id number] Date of Birth/Sex: May 23, 1955 (60 y.o. Male) Treating RN: Phillis Haggis Primary Care Physician: Rolm Gala Other Clinician: Referring Physician: Rolm Gala Treating Physician/Extender: Rudene Re in Treatment: 2 Edema Assessment Assessed: [Left: No] [Right: No] E[Left: dema] [Right: :] Calf Left: Right: Point of Measurement: 35 cm From Medial Instep 49 cm 48 cm Ankle Left: Right: Point of Measurement: 10 cm From Medial Instep 26.5 cm 25.7 cm Vascular Assessment Pulses: Posterior Tibial Palpable: [Left:Yes] [Right:Yes] Dorsalis Pedis Palpable: [Left:Yes] [Right:Yes] Extremity colors, hair growth, and conditions: Extremity Color: [Left:Hyperpigmented] [Right:Hyperpigmented] Temperature of Extremity: [Left:Warm] [Right:Warm] Capillary Refill: [Left:< 3 seconds] [Right:< 3 seconds] Toe Nail Assessment Left: Right: Thick: Yes Yes Discolored: Yes Yes Deformed: Yes Yes Improper Length and Hygiene: Yes Yes Electronic Signature(s) Signed: 11/16/2015 5:02:43 PM By: Alejandro Mulling Entered By: Alejandro Mulling on 11/16/2015 09:08:20 Fernando [name] ([id number]) -------------------------------------------------------------------------------- Multi Wound Chart  Details Patient Name: Fernando [name] Date of Service: 11/16/2015 8:45 AM Medical Record Number: [id number] Patient Account Number: [id number] Date of Birth/Sex: [birth year] (60 y.o. Male) Treating RN: Phillis Haggis Primary Care Physician: Rolm Gala Other Clinician: Referring Physician: Rolm Gala Treating Physician/Extender: Rudene Re in Treatment: 2 Vital Signs Height(in): 64 Pulse(bpm): 84 Weight(lbs): 291 Blood Pressure 126/61 (mmHg): Body Mass Index(BMI): 50 Temperature(F): 98.4 Respiratory Rate 22 (breaths/min): Photos: [1:No Photos] [2:No Photos] [N/A:N/A] Wound Location: [1:Right Lower Leg - Anterior Left Lower Leg - Lateral] [N/A:N/A] Wounding Event: [1:Gradually Appeared] [2:Gradually Appeared] [N/A:N/A] Primary Etiology: [1:Lymphedema] [2:Lymphedema] [N/A:N/A] Secondary Etiology: [1:Venous Leg Ulcer] [2:Venous Leg Ulcer] [N/A:N/A] Comorbid History: [1:Lymphedema, Chronic Obstructive Pulmonary Disease (COPD), Sleep Disease (COPD), Sleep Apnea, Congestive Heart  Apnea, Congestive Heart Failure, Hypertension, Peripheral Arterial Disease, Type II Diabetes Disease, Type II Diabetes]  [2:Lymphedema, Chronic Obstructive Pulmonary Failure, Hypertension, Peripheral Arterial] [N/A:N/A] Date Acquired: [1:07/03/2013] [2:07/03/2013] [N/A:N/A] Weeks of Treatment: [1:2] [2:2] [N/A:N/A] Wound Status: [1:Open] [2:Open] [N/A:N/A] Measurements L x W x D 6.5x9.6x0.2 [2:4x6.5x0.1] [N/A:N/A] (cm) Area (cm) : [1:49.009] [2:20.42] [N/A:N/A] Volume (cm) : [1:9.802] [2:2.042] [N/A:N/A] % Reduction in Area: [1:-36.20%] [2:20.00%] [N/A:N/A] % Reduction in Volume: -36.20% [2:20.00%] [N/A:N/A] Classification: [1:Full Thickness Without Exposed Support Structures] [2:Partial Thickness] [N/A:N/A] HBO Classification: [1:Grade 1] [2:Grade 1] [N/A:N/A] Exudate Amount: [1:Large] [2:Large] [N/A:N/A] Exudate Type: [1:Serous] [2:Serous] [N/A:N/A] Exudate Color: [1:amber] [2:amber]  [N/A:N/A] Wound Margin: [1:Distinct, outline attached Distinct, outline attached] [N/A:N/A] Granulation Amount: [1:None Present (0%)] [2:Medium (34-66%)] [N/A:N/A] Granulation Quality: N/A Pink N/A Necrotic Amount: Large (67-100%) Medium (34-66%) N/A Exposed Structures: Fascia: No Fascia: No N/A Fat: No Fat: No Tendon: No Tendon: No Muscle: No Muscle: No Joint: No Joint: No Bone: No Bone: No Limited to Skin Limited to Skin Breakdown Breakdown Epithelialization: None None N/A Periwound Skin Texture: Edema: Yes Edema: Yes N/A Periwound Skin Moist: Yes Moist: Yes N/A Moisture: Periwound Skin Color: Erythema: Yes No Abnormalities Noted N/A Erythema Location: Circumferential N/A N/A Temperature: No Abnormality No Abnormality N/A Tenderness on Yes Yes N/A Palpation: Wound Preparation: Ulcer Cleansing: Ulcer Cleansing: N/A Rinsed/Irrigated with Rinsed/Irrigated with Saline, Other: soap and Saline, Other: soap and water water Topical Anesthetic Topical Anesthetic Applied: Other: lidocaine Applied: Other: lidocaine 4% 4% Treatment Notes Electronic Signature(s) Signed: 11/16/2015 5:02:43 PM By: Alejandro Mulling Entered By: Alejandro Mulling on 11/16/2015 09:09:44 Fernando [name] ([id number]) -------------------------------------------------------------------------------- Multi-Disciplinary Care Plan Details Patient Name: Fernando [name] Date of Service: 11/16/2015 8:45 AM Medical Record Number: [id number] Patient Account Number: [id number] Date of Birth/Sex: 10/25/1955 (60 y.o. Male) Treating RN: Phillis Haggis Primary Care Physician: Rolm Gala Other Clinician: Referring Physician: Rolm Gala Treating Physician/Extender: Rudene Re in Treatment: 2 Active Inactive Abuse / Safety / Falls / Self Care Management Nursing Diagnoses: Impaired physical mobility Potential for falls Goals: Patient will remain injury free Date Initiated: 11/02/2015 Goal  Status: Active Interventions: Assess fall risk on admission and as needed Notes: Orientation to the Wound Care Program Nursing Diagnoses: Knowledge deficit related to the wound healing center program Goals: Patient/caregiver will verbalize understanding of the Wound Healing Center Program Date Initiated: 11/02/2015 Goal Status: Active Interventions: Provide education on orientation to the wound center Notes: Wound/Skin Impairment Nursing Diagnoses: Impaired tissue integrity Goals: Patient/caregiver will verbalize understanding of skin care regimen DAIVIK, OVERLEY ([id number]) Date Initiated: 11/02/2015 Goal Status: Active Ulcer/skin breakdown will have a volume reduction of 30% by week 4 Date Initiated: 11/02/2015 Goal Status: Active Ulcer/skin breakdown will have a volume reduction of 50% by week 8 Date Initiated: 11/02/2015 Goal Status: Active Ulcer/skin breakdown will have a volume reduction of 80% by week 12 Date Initiated: 11/02/2015 Goal Status: Active Ulcer/skin breakdown will heal within 14 weeks Date Initiated: 11/02/2015 Goal Status: Active Interventions: Assess patient/caregiver ability to obtain necessary supplies Assess patient/caregiver ability to perform ulcer/skin care regimen upon admission and as needed Assess ulceration(s) every visit Notes: Electronic Signature(s) Signed: 11/16/2015 5:02:43 PM By: Alejandro Mulling Entered By: Alejandro Mulling on 11/16/2015 09:09:36 Fernando [name] ([id number]) -------------------------------------------------------------------------------- Pain Assessment Details Patient Name: Fernando [name] Date of Service: 11/16/2015 8:45 AM Medical Record Number: [id number] Patient Account Number: [id number] Date of Birth/Sex: 1955/12/23 (60 y.o. Male) Treating RN: Phillis Haggis Primary Care Physician: Rolm Gala Other Clinician: Referring Physician: Rolm Gala Treating Physician/Extender: Rudene Re in  Treatment: 2 Active Problems Location of Pain Severity and Description of Pain Patient Has Paino No Site Locations With Dressing Change: No Pain Management and Medication Current Pain Management: Electronic Signature(s) Signed: 11/16/2015 5:02:43 PM By: Alejandro MullingPinkerton, Debra Entered By: Alejandro MullingPinkerton, Debra on 11/16/2015 09:00:10 Fernando MannanKUEIDER, Gus ([id number]) -------------------------------------------------------------------------------- Patient/Caregiver Education Details Patient Name: Fernando MannanKUEIDER, Sandon Date of Service: 11/16/2015 8:45 AM Medical Record Number: [id number] Patient Account Number: [id number] Date of Birth/Gender: 1955-10-04 [age] y.o. Male) Treating RN: Phillis HaggisPinkerton, Debi Primary Care Physician: Rolm GalaGrandis, Heidi Other Clinician: Referring Physician: Rolm GalaGrandis, Heidi Treating Physician/Extender: Rudene ReBritto, Errol Weeks in Treatment: 2 Education Assessment Education Provided To: Patient Education Topics Provided Wound/Skin Impairment: Handouts: Other: change dressing as ordered and do not get dressing wet Methods: Demonstration, Explain/Verbal Responses: State content correctly Electronic Signature(s) Signed: 11/16/2015 5:02:43 PM By: Alejandro MullingPinkerton, Debra Entered By: Alejandro MullingPinkerton, Debra on 11/16/2015 09:14:17 Fernando MannanKUEIDER, Macgregor ([id number]) -------------------------------------------------------------------------------- Wound Assessment Details Patient Name: Fernando MannanKUEIDER, Ryszard Date of Service: 11/16/2015 8:45 AM Medical Record Number: [id number] Patient Account Number: [id number] Date of Birth/Sex: 1955-10-04 [age] y.o. Male) Treating RN: Phillis HaggisPinkerton, Debi Primary Care Physician: Rolm GalaGrandis, Heidi Other Clinician: Referring Physician: Rolm GalaGrandis, Heidi Treating Physician/Extender: Rudene ReBritto, Errol Weeks in Treatment: 2 Wound Status Wound Number: 1 Primary Lymphedema Etiology: Wound Location: Right Lower Leg - Anterior Secondary Venous Leg Ulcer Wounding Event: Gradually Appeared Etiology: Date  Acquired: 07/03/2013 Wound Open Weeks Of Treatment: 2 Status: Clustered Wound: No Comorbid Lymphedema, Chronic Obstructive History: Pulmonary Disease (COPD), Sleep Apnea, Congestive Heart Failure, Hypertension, Peripheral Arterial Disease, Type II Diabetes Photos Photo Uploaded By: Alejandro MullingPinkerton, Debra on 11/16/2015 09:15:37 Wound Measurements Length: (cm) 6.5 Width: (cm) 9.6 Depth: (cm) 0.2 Area: (cm) 49.009 Volume: (cm) 9.802 % Reduction in Area: -36.2% % Reduction in Volume: -36.2% Epithelialization: None Tunneling: No Undermining: No Wound Description Full Thickness Without Classification: Exposed Support Structures Diabetic Severity Grade 1 (Wagner): Wound Margin: Distinct, outline attached Exudate Amount: Large Exudate Type: Serous Fernando MannanKUEIDER, Anush ([id number]) Foul Odor After Cleansing: No Exudate Color: amber Wound Bed Granulation Amount: None Present (0%) Exposed Structure Necrotic Amount: Large (67-100%) Fascia Exposed: No Necrotic Quality: Adherent Slough Fat Layer Exposed: No Tendon Exposed: No Muscle Exposed: No Joint Exposed: No Bone Exposed: No Limited to Skin Breakdown Periwound Skin Texture Texture Color No Abnormalities Noted: No No Abnormalities Noted: No Localized Edema: Yes Erythema: Yes Erythema Location: Circumferential Moisture No Abnormalities Noted: No Temperature / Pain Moist: Yes Temperature: No Abnormality Tenderness on Palpation: Yes Wound Preparation Ulcer Cleansing: Rinsed/Irrigated with Saline, Other: soap and water, Topical Anesthetic Applied: Other: lidocaine 4%, Treatment Notes Wound #1 (Right, Anterior Lower Leg) 1. Cleansed with: Clean wound with Normal Saline Cleanse wound with antibacterial soap and water 2. Anesthetic Topical Lidocaine 4% cream to wound bed prior to debridement 4. Dressing Applied: Aquacel Ag 5. Secondary Dressing Applied ABD Pad 7. Secured with Tape 3 Layer Compression System -  Bilateral Notes unna to anchor, Armed forces operational officerxtrasorb Electronic Signature(s) Signed: 11/16/2015 5:02:43 PM By: Alejandro MullingPinkerton, Debra Entered By: Alejandro MullingPinkerton, Debra on 11/16/2015 09:08:39 Fernando MannanKUEIDER, Rithy ([id number]) Fernando MannanKUEIDER, Regis ([id number]) -------------------------------------------------------------------------------- Wound Assessment Details Patient Name: Fernando MannanKUEIDER, Claborn Date of Service: 11/16/2015 8:45 AM Medical Record Number: [id number] Patient Account Number: [id number] Date of Birth/Sex: 1955-10-04 [age] y.o. Male) Treating RN: Phillis HaggisPinkerton, Debi Primary Care Physician: Rolm GalaGrandis, Heidi Other Clinician: Referring Physician: Rolm GalaGrandis, Heidi Treating Physician/Extender: Rudene ReBritto, Errol Weeks in Treatment: 2 Wound Status Wound Number: 2 Primary Lymphedema Etiology: Wound Location: Left Lower Leg - Lateral Secondary Venous Leg Ulcer Wounding Event: Gradually Appeared Etiology: Date Acquired: 07/03/2013 Wound Open Weeks Of Treatment:  2 Status: Clustered Wound: No Comorbid Lymphedema, Chronic Obstructive History: Pulmonary Disease (COPD), Sleep Apnea, Congestive Heart Failure, Hypertension, Peripheral Arterial Disease, Type II Diabetes Photos Photo Uploaded By: Alejandro MullingPinkerton, Debra on 11/16/2015 09:15:37 Wound Measurements Length: (cm) 4 Width: (cm) 6.5 Depth: (cm) 0.1 Area: (cm) 20.42 Volume: (cm) 2.042 % Reduction in Area: 20% % Reduction in Volume: 20% Epithelialization: None Tunneling: No Undermining: No Wound Description Classification: Partial Thickness Foul O Diabetic Severity (Wagner): Grade 1 Wound Margin: Distinct, outline attached Exudate Amount: Large Exudate Type: Serous Exudate Color: amber Fernando MannanKUEIDER, Trustin ([id number]) dor After Cleansing: No Wound Bed Granulation Amount: Medium (34-66%) Exposed Structure Granulation Quality: Pink Fascia Exposed: No Necrotic Amount: Medium (34-66%) Fat Layer Exposed: No Necrotic Quality: Adherent Slough Tendon Exposed: No Muscle  Exposed: No Joint Exposed: No Bone Exposed: No Limited to Skin Breakdown Periwound Skin Texture Texture Color No Abnormalities Noted: No No Abnormalities Noted: No Localized Edema: Yes Temperature / Pain Moisture Temperature: No Abnormality No Abnormalities Noted: No Tenderness on Palpation: Yes Moist: Yes Wound Preparation Ulcer Cleansing: Rinsed/Irrigated with Saline, Other: soap and water, Topical Anesthetic Applied: Other: lidocaine 4%, Treatment Notes Wound #2 (Left, Lateral Lower Leg) 1. Cleansed with: Clean wound with Normal Saline Cleanse wound with antibacterial soap and water 2. Anesthetic Topical Lidocaine 4% cream to wound bed prior to debridement 4. Dressing Applied: Aquacel Ag 5. Secondary Dressing Applied ABD Pad 7. Secured with Tape 3 Layer Compression System - Bilateral Notes unna to anchor, Armed forces operational officerxtrasorb Electronic Signature(s) Signed: 11/16/2015 5:02:43 PM By: Alejandro MullingPinkerton, Debra Entered By: Alejandro MullingPinkerton, Debra on 11/16/2015 09:09:25 Fernando MannanKUEIDER, Elic ([id number]) -------------------------------------------------------------------------------- Vitals Details Patient Name: Fernando MannanKUEIDER, Lennex Date of Service: 11/16/2015 8:45 AM Medical Record Number: [id number] Patient Account Number: [id number] Date of Birth/Sex: November 15, 1955 [age] y.o. Male) Treating RN: Phillis HaggisPinkerton, Debi Primary Care Physician: Rolm GalaGrandis, Heidi Other Clinician: Referring Physician: Rolm GalaGrandis, Heidi Treating Physician/Extender: Rudene ReBritto, Errol Weeks in Treatment: 2 Vital Signs Time Taken: 09:02 Temperature (F): 98.4 Height (in): 64 Pulse (bpm): 84 Weight (lbs): 291 Respiratory Rate (breaths/min): 22 Body Mass Index (BMI): 49.9 Blood Pressure (mmHg): 126/61 Reference Range: 80 - 120 mg / dl Electronic Signature(s) Signed: 11/16/2015 5:02:43 PM By: Alejandro MullingPinkerton, Debra Entered By: Alejandro MullingPinkerton, Debra on 11/16/2015 09:02:55

## 2015-11-17 NOTE — Progress Notes (Signed)
ETHERIDGE, GEIL ([id number]) Visit Report for 11/16/2015 Chief Complaint Document Details Patient Name: Fernando [name], Fernando [name] Date of Service: 11/16/2015 8:45 AM Medical Record Number: [id number] Patient Account Number: [id number] Date of Birth/Sex: 07/14/1955 (60 y.o. Male) Treating RN: Phillis Haggis Primary Care Physician: Rolm Gala Other Clinician: Referring Physician: Rolm Gala Treating Physician/Extender: Rudene Re in Treatment: 2 Information Obtained from: Patient Chief Complaint Patient presents to the wound care center for a consult due non healing wound Both lower extremities with swelling and has had this for at least 4 years Electronic Signature(s) Signed: 11/16/2015 9:47:56 AM By: Evlyn Kanner MD, FACS Entered By: Evlyn Kanner on 11/16/2015 09:47:56 Fernando [name] ([id number]) -------------------------------------------------------------------------------- Debridement Details Patient Name: Fernando [name] Date of Service: 11/16/2015 8:45 AM Medical Record Number: [id number] Patient Account Number: [id number] Date of Birth/Sex: Feb 17, 1955 (60 y.o. Male) Treating RN: Phillis Haggis Primary Care Physician: Rolm Gala Other Clinician: Referring Physician: Rolm Gala Treating Physician/Extender: Rudene Re in Treatment: 2 Debridement Performed for Wound #1 Right,Anterior Lower Leg Assessment: Performed By: Physician Evlyn Kanner, MD Debridement: Debridement Pre-procedure Yes - 09:34 Verification/Time Out Taken: Start Time: 09:35 Pain Control: Lidocaine 4% Topical Solution Level: Skin/Subcutaneous Tissue Total Area Debrided (L x 6 (cm) x 6 (cm) = 36 (cm) W): Tissue and other Viable, Non-Viable, Exudate, Fibrin/Slough, Subcutaneous material debrided: Instrument: Curette Bleeding: Minimum Hemostasis Achieved: Pressure End Time: 09:37 Procedural Pain: 0 Post Procedural Pain: 0 Response to Treatment: Procedure was tolerated  well Post Debridement Measurements of Total Wound Length: (cm) 6.5 Width: (cm) 9.6 Depth: (cm) 0.3 Volume: (cm) 14.703 Character of Wound/Ulcer Post Requires Further Debridement Debridement: Severity of Tissue Post Debridement: Fat layer exposed Post Procedure Diagnosis Same as Pre-procedure Electronic Signature(s) Signed: 11/16/2015 9:47:50 AM By: Evlyn Kanner MD, FACS Signed: 11/16/2015 5:02:43 PM By: Alejandro Mulling Entered By: Evlyn Kanner on 11/16/2015 09:47:50 Fernando [name] ([id number]) Fernando [name] ([id number]) -------------------------------------------------------------------------------- HPI Details Patient Name: Fernando [name] Date of Service: 11/16/2015 8:45 AM Medical Record Number: [id number] Patient Account Number: [id number] Date of Birth/Sex: 1955/05/26 (60 y.o. Male) Treating RN: Phillis Haggis Primary Care Physician: Rolm Gala Other Clinician: Referring Physician: Rolm Gala Treating Physician/Extender: Rudene Re in Treatment: 2 History of Present Illness Location: ulcers both lower extremities right worse than left Quality: Patient reports experiencing a dull pain to affected area(s). Severity: Patient states wound are getting worse. Duration: Patient has had the wound for > 3 years prior to seeking treatment at the wound center Timing: Pain in wound is Intermittent (comes and goes Context: The wound appeared gradually over time Modifying Factors: Consults to this date include:wound center at [facility] and cardiology Associated Signs and Symptoms: Patient reports having increase swelling. HPI Description: 60 year old gentleman who has diabetes mellitus, been referred to was for use with lymphedema and peripheral vascular disease. Stand he's had a vascular workup ordered and has a scale opinion done recently. He is a smoker and smokes about 2 packs of cigarettes a day. past medical history significant for coronary artery disease,  CHF, COPD, hyperlipidemia, hypertension, lower extremity edema, obesity, nicotine addiction, status post chronically occluded right coronary artery, history of obstructive sleep apnea treated with a CPAP machine. he is status post hernia repair, tonsillectomy, appendectomy and angioplasty. Note is Made that he has been treated at the wound center for chronic lymphedema with ulceration which improves with Unna's but recurs when he does not have them. hemoglobin A1c was 6.6%. 11/09/2015 -- the patient has not had his vascular tests so far and from what the wife  tells me his compression wraps have been slipping and they need to be changed more frequently. He has several excuses for not stopping to smoke and we have address this again. Electronic Signature(s) Signed: 11/16/2015 9:48:00 AM By: Evlyn KannerBritto, Nazaiah Navarrete MD, FACS Entered By: Evlyn KannerBritto, Verlie Hellenbrand on 11/16/2015 09:48:00 Fernando MannanKUEIDER, Warren ([id number]) -------------------------------------------------------------------------------- Physical Exam Details Patient Name: Fernando [name] Date of Service: 11/16/2015 8:45 AM Medical Record Number: [id number] Patient Account Number: [id number] Date of Birth/Sex: [birth year](60 y.o. Male) Treating RN: Phillis HaggisPinkerton, Debi Primary Care Physician: Rolm GalaGrandis, Heidi Other Clinician: Referring Physician: Rolm GalaGrandis, Heidi Treating Physician/Extender: Rudene ReBritto, Maiana Hennigan Weeks in Treatment: 2 Constitutional . Pulse regular. Respirations normal and unlabored. Afebrile. . Eyes Nonicteric. Reactive to light. Ears, Nose, Mouth, and Throat Lips, teeth, and gums WNL.Marland Kitchen. Moist mucosa without lesions. Neck supple and nontender. No palpable supraclavicular or cervical adenopathy. Normal sized without goiter. Respiratory WNL. No retractions.. Cardiovascular Pedal Pulses WNL. No clubbing, cyanosis or edema. Lymphatic No adneopathy. No adenopathy. No adenopathy. Musculoskeletal Adexa without tenderness or enlargement.. Digits and nails  w/o clubbing, cyanosis, infection, petechiae, ischemia, or inflammatory conditions.. Integumentary (Hair, Skin) No suspicious lesions. No crepitus or fluctuance. No peri-wound warmth or erythema. No masses.Marland Kitchen. Psychiatric Judgement and insight Intact.. No evidence of depression, anxiety, or agitation.. Notes his lymphedema is much better and he has no evidence of cellulitis. The left leg does not need debridement but the right leg has a lot of debris and I have sharply debrided this today. The bleeding controlled with pressure Electronic Signature(s) Signed: 11/16/2015 9:48:50 AM By: Evlyn KannerBritto, Leeanna Slaby MD, FACS Entered By: Evlyn KannerBritto, Zoria Rawlinson on 11/16/2015 09:48:49 Fernando MannanKUEIDER, Mordche ([id number]) -------------------------------------------------------------------------------- Physician Orders Details Patient Name: Fernando MannanKUEIDER, Lindel Date of Service: 11/16/2015 8:45 AM Medical Record Number: [id number] Patient Account Number: [id number] Date of Birth/Sex: [birth year](60 y.o. Male) Treating RN: Phillis HaggisPinkerton, Debi Primary Care Physician: Rolm GalaGrandis, Heidi Other Clinician: Referring Physician: Rolm GalaGrandis, Heidi Treating Physician/Extender: Rudene ReBritto, Kym Fenter Weeks in Treatment: 2 Verbal / Phone Orders: Yes ClinicianAshok Cordia: Pinkerton, Debi Read Back and Verified: Yes Diagnosis Coding Wound Cleansing Wound #1 Right,Anterior Lower Leg o May shower with protection. o No tub bath. Wound #2 Left,Lateral Lower Leg o May shower with protection. o No tub bath. Anesthetic Wound #1 Right,Anterior Lower Leg o Topical Lidocaine 4% cream applied to wound bed prior to debridement - for clinic use Wound #2 Left,Lateral Lower Leg o Topical Lidocaine 4% cream applied to wound bed prior to debridement - for clinic use Primary Wound Dressing Wound #1 Right,Anterior Lower Leg o Aquacel Ag Wound #2 Left,Lateral Lower Leg o Aquacel Ag Secondary Dressing Wound #1 Right,Anterior Lower Leg o ABD pad o  XtraSorb Wound #2 Left,Lateral Lower Leg o ABD pad o XtraSorb Dressing Change Frequency Wound #1 Right,Anterior Lower Leg o Three times weekly - Pt comes to wound care clinic on Mondays [facility] to change on Wednesdays and Fridays Fernando MannanKUEIDER, Omarie ([id number]) Wound #2 Left,Lateral Lower Leg o Three times weekly - [facility] Follow-up Appointments Wound #1 Right,Anterior Lower Leg o Return Appointment in 1 week. Wound #2 Left,Lateral Lower Leg o Return Appointment in 1 week. Edema Control Wound #1 Right,Anterior Lower Leg o 3 Layer Compression System - Bilateral - unna to anchor o Elevate legs to the level of the heart and pump ankles as often as possible Wound #2 Left,Lateral Lower Leg o 3 Layer Compression System - Bilateral - unna to anchor o Elevate legs to the level of the heart and pump  ankles as often as possible Additional Orders / Instructions Wound #1 Right,Anterior Lower Leg o Stop Smoking o Increase protein intake. Wound #2 Left,Lateral Lower Leg o Stop Smoking o Increase protein intake. Home Health Wound #1 Right,Anterior Lower Leg o Continue [facility] Visits - [facility] o Home Health Nurse may visit PRN to address patientos wound care needs. o FACE TO FACE ENCOUNTER: MEDICARE and MEDICAID PATIENTS: I certify that this patient is under my care and that I had a face-to-face encounter that meets the physician face-to-face encounter requirements with this patient on this date. The encounter with the patient was in whole or in part for the following MEDICAL CONDITION: (primary reason for Home Healthcare) MEDICAL NECESSITY: I certify, that based on my findings, NURSING services are a medically necessary home health service. HOME BOUND STATUS: I certify that my clinical findings support that this patient is homebound (i.e., Due to illness or injury, pt requires  aid of supportive devices such as crutches, cane, wheelchairs, walkers, the use of special transportation or the assistance of another person to leave their place of residence. There is a normal inability to leave the home and doing so requires considerable and taxing effort. Other absences are for medical reasons / religious services and are infrequent or of short duration when for other reasons). KEANEN, DOHSE ([id number]) o If current dressing causes regression in wound condition, may D/C ordered dressing product/s and apply Normal Saline Moist Dressing daily until next Wound Healing Center / Other MD appointment. Notify Wound Healing Center of regression in wound condition at [phone]. o Please direct any NON-WOUND related issues/requests for orders to patient's Primary Care Physician Wound #2 Left,Lateral Lower Leg o Continue Home Health Visits - [facility] o Home Health Nurse may visit PRN to address patientos wound care needs. o FACE TO FACE ENCOUNTER: MEDICARE and MEDICAID PATIENTS: I certify that this patient is under my care and that I had a face-to-face encounter that meets the physician face-to-face encounter requirements with this patient on this date. The encounter with the patient was in whole or in part for the following MEDICAL CONDITION: (primary reason for Home Healthcare) MEDICAL NECESSITY: I certify, that based on my findings, NURSING services are a medically necessary home health service. HOME BOUND STATUS: I certify that my clinical findings support that this patient is homebound (i.e., Due to illness or injury, pt requires aid of supportive devices such as crutches, cane, wheelchairs, walkers, the use of special transportation or the assistance of another person to leave their place of residence. There is a normal inability to leave the home and doing so requires considerable and taxing effort. Other absences are for medical reasons / religious  services and are infrequent or of short duration when for other reasons). o If current dressing causes regression in wound condition, may D/C ordered dressing product/s and apply Normal Saline Moist Dressing daily until next Wound Healing Center / Other MD appointment. Notify Wound Healing Center of regression in wound condition at [phone]. o Please direct any NON-WOUND related issues/requests for orders to patient's Primary Care Physician Medications-please add to medication list. Wound #1 Right,Anterior Lower Leg o Other: - Vitamin A, Vitamin C, Zinc Wound #2 Left,Lateral Lower Leg o Other: - Vitamin A, Vitamin C, Zinc Electronic Signature(s) Signed: 11/16/2015 4:28:36 PM By: Evlyn Kanner MD, FACS Signed: 11/16/2015 5:02:43 PM By: Alejandro Mulling Entered By: Alejandro Mulling on 11/16/2015 09:38:09 Fernando [name] ([id number]) -------------------------------------------------------------------------------- Problem List Details Patient Name: Fernando [name] Date  of Service: 11/16/2015 8:45 AM Medical Record Number: [id number] Patient Account Number: [id number] Date of Birth/Sex: 1955-07-22 (60 y.o. Male) Treating RN: Phillis Haggis Primary Care Physician: Rolm Gala Other Clinician: Referring Physician: Rolm Gala Treating Physician/Extender: Rudene Re in Treatment: 2 Active Problems ICD-10 Encounter Code Description Active Date Diagnosis E11.622 Type 2 diabetes mellitus with other skin ulcer 11/02/2015 Yes I89.0 Lymphedema, not elsewhere classified 11/02/2015 Yes E66.01 Morbid (severe) obesity due to excess calories 11/02/2015 Yes L97.222 Non-pressure chronic ulcer of left calf with fat layer 11/02/2015 Yes exposed L97.212 Non-pressure chronic ulcer of right calf with fat layer 11/02/2015 Yes exposed Inactive Problems Resolved Problems Electronic Signature(s) Signed: 11/16/2015 9:47:19 AM By: Evlyn Kanner MD, FACS Entered By: Evlyn Kanner on 11/16/2015 09:47:19 Fernando [name] ([id number]) -------------------------------------------------------------------------------- Progress Note Details Patient Name: Fernando [name] Date of Service: 11/16/2015 8:45 AM Medical Record Number: [id number] Patient Account Number: [id number] Date of Birth/Sex: 1955/10/05 (60 y.o. Male) Treating RN: Phillis Haggis Primary Care Physician: Rolm Gala Other Clinician: Referring Physician: Rolm Gala Treating Physician/Extender: Rudene Re in Treatment: 2 Subjective Chief Complaint Information obtained from Patient Patient presents to the wound care center for a consult due non healing wound Both lower extremities with swelling and has had this for at least 4 years History of Present Illness (HPI) The following HPI elements were documented for the patient's wound: Location: ulcers both lower extremities right worse than left Quality: Patient reports experiencing a dull pain to affected area(s). Severity: Patient states wound are getting worse. Duration: Patient has had the wound for > 3 years prior to seeking treatment at the wound center Timing: Pain in wound is Intermittent (comes and goes Context: The wound appeared gradually over time Modifying Factors: Consults to this date include:wound [facility] at [facility] and cardiology Associated Signs and Symptoms: Patient reports having increase swelling. 60 year old gentleman who has diabetes mellitus, been referred to was for use with lymphedema and peripheral vascular disease. Stand he's had a vascular workup ordered and has a scale opinion done recently. He is a smoker and smokes about 2 packs of cigarettes a day. past medical history significant for coronary artery disease, CHF, COPD, hyperlipidemia, hypertension, lower extremity edema, obesity, nicotine addiction, status post chronically occluded right coronary artery, history of obstructive sleep apnea treated with a CPAP  machine. he is status post hernia repair, tonsillectomy, appendectomy and angioplasty. Note is Made that he has been treated at the wound center for chronic lymphedema with ulceration which improves with Unna's but recurs when he does not have them. hemoglobin A1c was 6.6%. 11/09/2015 -- the patient has not had his vascular tests so far and from what the wife tells me his compression wraps have been slipping and they need to be changed more frequently. He has several excuses for not stopping to smoke and we have address this again. Objective ROSHAD, HACK ([id number]) Constitutional Pulse regular. Respirations normal and unlabored. Afebrile. Vitals Time Taken: 9:02 AM, Height: 64 in, Weight: 291 lbs, BMI: 49.9, Temperature: 98.4 F, Pulse: 84 bpm, Respiratory Rate: 22 breaths/min, Blood Pressure: 126/61 mmHg. Eyes Nonicteric. Reactive to light. Ears, Nose, Mouth, and Throat Lips, teeth, and gums WNL.Marland Kitchen Moist mucosa without lesions. Neck supple and nontender. No palpable supraclavicular or cervical adenopathy. Normal sized without goiter. Respiratory WNL. No retractions.. Cardiovascular Pedal Pulses WNL. No clubbing, cyanosis or edema. Lymphatic No adneopathy. No adenopathy. No adenopathy. Musculoskeletal Adexa without tenderness or enlargement.. Digits and nails w/o clubbing, cyanosis, infection, petechiae, ischemia, or inflammatory conditions.Marland Kitchen Psychiatric Judgement and  insight Intact.. No evidence of depression, anxiety, or agitation.. General Notes: his lymphedema is much better and he has no evidence of cellulitis. The left leg does not need debridement but the right leg has a lot of debris and I have sharply debrided this today. The bleeding controlled with pressure Integumentary (Hair, Skin) No suspicious lesions. No crepitus or fluctuance. No peri-wound warmth or erythema. No masses.. Wound #1 status is Open. Original cause of wound was Gradually Appeared. The wound is  located on the Right,Anterior Lower Leg. The wound measures 6.5cm length x 9.6cm width x 0.2cm depth; 49.009cm^2 area and 9.802cm^3 volume. The wound is limited to skin breakdown. There is no tunneling or undermining noted. There is a large amount of serous drainage noted. The wound margin is distinct with the outline attached to the wound base. There is no granulation within the wound bed. There is a large (67-100%) amount of necrotic tissue within the wound bed including Adherent Slough. The periwound skin appearance exhibited: Localized Edema, Moist, Erythema. The surrounding wound skin color is noted with erythema which is circumferential. Periwound temperature was noted as No Abnormality. The periwound has tenderness on palpation. Fernando MannanKUEIDER, Dalin ([id number]) Wound #2 status is Open. Original cause of wound was Gradually Appeared. The wound is located on the Left,Lateral Lower Leg. The wound measures 4cm length x 6.5cm width x 0.1cm depth; 20.42cm^2 area and 2.042cm^3 volume. The wound is limited to skin breakdown. There is no tunneling or undermining noted. There is a large amount of serous drainage noted. The wound margin is distinct with the outline attached to the wound base. There is medium (34-66%) pink granulation within the wound bed. There is a medium (34-66%) amount of necrotic tissue within the wound bed including Adherent Slough. The periwound skin appearance exhibited: Localized Edema, Moist. Periwound temperature was noted as No Abnormality. The periwound has tenderness on palpation. Assessment Active Problems ICD-10 E11.622 - Type 2 diabetes mellitus with other skin ulcer I89.0 - Lymphedema, not elsewhere classified E66.01 - Morbid (severe) obesity due to excess calories L97.222 - Non-pressure chronic ulcer of left calf with fat layer exposed L97.212 - Non-pressure chronic ulcer of right calf with fat layer exposed Procedures Wound #1 Wound #1 is a Lymphedema located  on the Right,Anterior Lower Leg . There was a Skin/Subcutaneous Tissue Debridement ([id number]) debridement with total area of 36 sq cm performed by Evlyn KannerBritto, Katiana Ruland, MD. with the following instrument(s): Curette to remove Viable and Non-Viable tissue/material including Exudate, Fibrin/Slough, and Subcutaneous after achieving pain control using Lidocaine 4% Topical Solution. A time out was conducted at 09:34, prior to the start of the procedure. A Minimum amount of bleeding was controlled with Pressure. The procedure was tolerated well with a pain level of 0 throughout and a pain level of 0 following the procedure. Post Debridement Measurements: 6.5cm length x 9.6cm width x 0.3cm depth; 14.703cm^3 volume. Character of Wound/Ulcer Post Debridement requires further debridement. Severity of Tissue Post Debridement is: Fat layer exposed. Post procedure Diagnosis Wound #1: Same as Pre-Procedure Plan Fernando MannanKUEIDER, Ehan ([id number]) Wound Cleansing: Wound #1 Right,Anterior Lower Leg: May shower with protection. No tub bath. Wound #2 Left,Lateral Lower Leg: May shower with protection. No tub bath. Anesthetic: Wound #1 Right,Anterior Lower Leg: Topical Lidocaine 4% cream applied to wound bed prior to debridement - for clinic use Wound #2 Left,Lateral Lower Leg: Topical Lidocaine 4% cream applied to wound bed prior to debridement - for clinic use Primary Wound Dressing: Wound #1 Right,Anterior Lower Leg: Aquacel Ag  Wound #2 Left,Lateral Lower Leg: Aquacel Ag Secondary Dressing: Wound #1 Right,Anterior Lower Leg: ABD pad XtraSorb Wound #2 Left,Lateral Lower Leg: ABD pad XtraSorb Dressing Change Frequency: Wound #1 Right,Anterior Lower Leg: Three times weekly - Pt comes to wound care clinic on Mondays [facility] to change on Wednesdays and Fridays Wound #2 Left,Lateral Lower Leg: Three times weekly - Pt comes to wound care clinic on Mondays [facility] to change on Wednesdays and Fridays Follow-up  Appointments: Wound #1 Right,Anterior Lower Leg: Return Appointment in 1 week. Wound #2 Left,Lateral Lower Leg: Return Appointment in 1 week. Edema Control: Wound #1 Right,Anterior Lower Leg: 3 Layer Compression System - Bilateral - unna to anchor Elevate legs to the level of the heart and pump ankles as often as possible Wound #2 Left,Lateral Lower Leg: 3 Layer Compression System - Bilateral - unna to anchor Elevate legs to the level of the heart and pump ankles as often as possible Additional Orders / Instructions: Wound #1 Right,Anterior Lower Leg: Stop Smoking Increase protein intake. Wound #2 Left,Lateral Lower Leg: Stop Smoking Increase protein intake. RALIEGH, SCOBIE ([id number]) Home Health: Wound #1 Right,Anterior Lower Leg: Continue Home Health Visits - [facility] home Home Health Nurse may visit PRN to address patient s wound care needs. FACE TO FACE ENCOUNTER: MEDICARE and MEDICAID PATIENTS: I certify that this patient is under my care and that I had a face-to-face encounter that meets the physician face-to-face encounter requirements with this patient on this date. The encounter with the patient was in whole or in part for the following MEDICAL CONDITION: (primary reason for Home Healthcare) MEDICAL NECESSITY: I certify, that based on my findings, NURSING services are a medically necessary home health service. HOME BOUND STATUS: I certify that my clinical findings support that this patient is homebound (i.e., Due to illness or injury, pt requires aid of supportive devices such as crutches, cane, wheelchairs, walkers, the use of special transportation or the assistance of another person to leave their place of residence. There is a normal inability to leave the home and doing so requires considerable and taxing effort. Other absences are for medical reasons / religious services and are infrequent or of short duration when for other reasons). If current dressing causes  regression in wound condition, may D/C ordered dressing product/s and apply Normal Saline Moist Dressing daily until next Wound Healing Center / Other MD appointment. Notify Wound Healing Center of regression in wound condition at [phone]. Please direct any NON-WOUND related issues/requests for orders to patient's Primary Care Physician Wound #2 Left,Lateral Lower Leg: Continue Home Health Visits - [facility] Home Health Nurse may visit PRN to address patient s wound care needs. FACE TO FACE ENCOUNTER: MEDICARE and MEDICAID PATIENTS: I certify that this patient is under my care and that I had a face-to-face encounter that meets the physician face-to-face encounter requirements with this patient on this date. The encounter with the patient was in whole or in part for the following MEDICAL CONDITION: (primary reason for Home Healthcare) MEDICAL NECESSITY: I certify, that based on my findings, NURSING services are a medically necessary home health service. HOME BOUND STATUS: I certify that my clinical findings support that this patient is homebound (i.e., Due to illness or injury, pt requires aid of supportive devices such as crutches, cane, wheelchairs, walkers, the use of special transportation or the assistance of another person to leave their place of residence. There is a normal inability to leave the home and doing so requires considerable and  taxing effort. Other absences are for medical reasons / religious services and are infrequent or of short duration when for other reasons). If current dressing causes regression in wound condition, may D/C ordered dressing product/s and apply Normal Saline Moist Dressing daily until next Wound Healing Center / Other MD appointment. Notify Wound Healing Center of regression in wound condition at [phone]. Please direct any NON-WOUND related issues/requests for orders to patient's Primary Care Physician Medications-please add to medication  list.: Wound #1 Right,Anterior Lower Leg: Other: - Vitamin A, Vitamin C, Zinc Wound #2 Left,Lateral Lower Leg: Other: - Vitamin A, Vitamin C, Zinc I have recommended: 1. silver alginate and a Profore 3 layer compression.we will have home health come and change this for a total of 3 times a week. FILEMON, BRETON ([id number]) 2. Elevation and exercise 3. completly giving up smoking and I have spent time discussing this again. He is vehemently opposed to giving up smoking. 4. arterial and venous duplex studies have already been scheduled -- appointment still pending. 5. Control of his diabetes mellitus and CHF 6. regular visits to the wound center Electronic Signature(s) Signed: 11/16/2015 9:49:21 AM By: Evlyn Kanner MD, FACS Entered By: Evlyn Kanner on 11/16/2015 09:49:21 Fernando [name] ([id number]) -------------------------------------------------------------------------------- SuperBill Details Patient Name: Fernando [name] Date of Service: 11/16/2015 Medical Record Number: [id number] Patient Account Number: [id number] Date of Birth/Sex: February 04, 1955 (60 y.o. Male) Treating RN: Phillis Haggis Primary Care Physician: Rolm Gala Other Clinician: Referring Physician: Rolm Gala Treating Physician/Extender: Rudene Re in Treatment: 2 Diagnosis Coding ICD-10 Codes Code Description E11.622 Type 2 diabetes mellitus with other skin ulcer I89.0 Lymphedema, not elsewhere classified E66.01 Morbid (severe) obesity due to excess calories L97.222 Non-pressure chronic ulcer of left calf with fat layer exposed L97.212 Non-pressure chronic ulcer of right calf with fat layer exposed Facility Procedures CPT4 Code: 29528413 Description: 11042 - DEB SUBQ TISSUE 20 SQ CM/< ICD-10 Description Diagnosis E11.622 Type 2 diabetes mellitus with other skin ulcer I89.0 Lymphedema, not elsewhere classified E66.01 Morbid (severe) obesity due to excess calories L97.222 Non-pressure   chronic ulcer of left calf with fat l Modifier: ayer exposed Quantity: 1 CPT4 Code: 24401027 Description: 11045 - DEB SUBQ TISS EA ADDL 20CM ICD-10 Description Diagnosis E11.622 Type 2 diabetes mellitus with other skin ulcer I89.0 Lymphedema, not elsewhere classified E66.01 Morbid (severe) obesity due to excess calories L97.222 Non-pressure  chronic ulcer of left calf with fat l Modifier: ayer exposed Quantity: 1 Physician Procedures CPT4 Code: [id number] Guynes, STEP Description: 11042 - WC PHYS SUBQ TISS 20 SQ CM ICD-10 Description Diagnosis E11.622 Type 2 diabetes mellitus with other skin ulcer I89.0 Lymphedema, not elsewhere classified E66.01 Morbid (severe) obesity due to excess calories HEN ([id number]) Modifier: Quantity: 1 Electronic Signature(s) Signed: 11/16/2015 9:49:40 AM By: Evlyn Kanner MD, FACS Entered By: Evlyn Kanner on 11/16/2015 09:49:39

## 2015-11-20 DIAGNOSIS — J449 Chronic obstructive pulmonary disease, unspecified: Secondary | ICD-10-CM | POA: Diagnosis not present

## 2015-11-23 ENCOUNTER — Ambulatory Visit: Payer: PPO | Admitting: Surgery

## 2015-11-25 DIAGNOSIS — I509 Heart failure, unspecified: Secondary | ICD-10-CM | POA: Diagnosis not present

## 2015-11-25 DIAGNOSIS — J449 Chronic obstructive pulmonary disease, unspecified: Secondary | ICD-10-CM | POA: Diagnosis not present

## 2015-11-25 DIAGNOSIS — I110 Hypertensive heart disease with heart failure: Secondary | ICD-10-CM | POA: Diagnosis not present

## 2015-11-25 DIAGNOSIS — E11622 Type 2 diabetes mellitus with other skin ulcer: Secondary | ICD-10-CM | POA: Diagnosis not present

## 2015-11-25 DIAGNOSIS — I2510 Atherosclerotic heart disease of native coronary artery without angina pectoris: Secondary | ICD-10-CM | POA: Diagnosis not present

## 2015-11-25 DIAGNOSIS — I2582 Chronic total occlusion of coronary artery: Secondary | ICD-10-CM | POA: Diagnosis not present

## 2015-11-25 DIAGNOSIS — E1151 Type 2 diabetes mellitus with diabetic peripheral angiopathy without gangrene: Secondary | ICD-10-CM | POA: Diagnosis not present

## 2015-11-25 DIAGNOSIS — E785 Hyperlipidemia, unspecified: Secondary | ICD-10-CM | POA: Diagnosis not present

## 2015-11-25 DIAGNOSIS — L97221 Non-pressure chronic ulcer of left calf limited to breakdown of skin: Secondary | ICD-10-CM | POA: Diagnosis not present

## 2015-11-25 DIAGNOSIS — L97211 Non-pressure chronic ulcer of right calf limited to breakdown of skin: Secondary | ICD-10-CM | POA: Diagnosis not present

## 2015-11-25 DIAGNOSIS — Z6841 Body Mass Index (BMI) 40.0 and over, adult: Secondary | ICD-10-CM | POA: Diagnosis not present

## 2015-11-25 DIAGNOSIS — I890 Lymphedema, not elsewhere classified: Secondary | ICD-10-CM | POA: Diagnosis not present

## 2015-11-30 ENCOUNTER — Encounter: Payer: PPO | Admitting: Surgery

## 2015-11-30 DIAGNOSIS — E11622 Type 2 diabetes mellitus with other skin ulcer: Secondary | ICD-10-CM | POA: Diagnosis not present

## 2015-11-30 DIAGNOSIS — L97221 Non-pressure chronic ulcer of left calf limited to breakdown of skin: Secondary | ICD-10-CM | POA: Diagnosis not present

## 2015-11-30 DIAGNOSIS — L97812 Non-pressure chronic ulcer of other part of right lower leg with fat layer exposed: Secondary | ICD-10-CM | POA: Diagnosis not present

## 2015-11-30 DIAGNOSIS — L97919 Non-pressure chronic ulcer of unspecified part of right lower leg with unspecified severity: Secondary | ICD-10-CM | POA: Diagnosis not present

## 2015-11-30 DIAGNOSIS — I890 Lymphedema, not elsewhere classified: Secondary | ICD-10-CM | POA: Diagnosis not present

## 2015-12-01 DIAGNOSIS — E538 Deficiency of other specified B group vitamins: Secondary | ICD-10-CM | POA: Diagnosis not present

## 2015-12-01 NOTE — Progress Notes (Signed)
Fernando [name], Fernando [name] ([id number]) Visit Report for 11/30/2015 Chief Complaint Document Details Patient Name: Fernando [name], Fernando [name] Date of Service: 11/30/2015 8:45 AM Medical Record Number: [id number] Patient Account Number: [id number] Date of Birth/Sex: 02-Jan-1956 [age] y.o. Male) Treating RN: Huel CoventryWoody, Kim Primary Care Physician: Rolm GalaGrandis, Heidi Other Clinician: Referring Physician: Rolm GalaGrandis, Heidi Treating Physician/Extender: Rudene ReBritto, [place] Weeks in Treatment: 4 Information Obtained from: Patient Chief Complaint Patient presents to the wound care center for a consult due non healing wound Both lower extremities with swelling and has had this for at least 4 years Electronic Signature(s) Signed: 11/30/2015 9:31:55 AM By: Evlyn KannerBritto, Teonia Yager MD, FACS Entered By: Evlyn KannerBritto, Amberia Bayless on 11/30/2015 09:31:55 Fernando [name], Fernando [name] ([id number]) -------------------------------------------------------------------------------- Debridement Details Patient Name: Fernando [name], Advait Date of Service: 11/30/2015 8:45 AM Medical Record Number: [id number] Patient Account Number: [id number] Date of Birth/Sex: 02-Jan-1956 [age] y.o. Male) Treating RN: Huel CoventryWoody, Kim Primary Care Physician: Rolm GalaGrandis, Heidi Other Clinician: Referring Physician: Rolm GalaGrandis, Heidi Treating Physician/Extender: Rudene ReBritto, Toluwanimi Radebaugh Weeks in Treatment: 4 Debridement Performed for Wound #1 Right,Anterior Lower Leg Assessment: Performed By: Physician Evlyn KannerBritto, Jiovani Mccammon, MD Debridement: Open Wound/Selective Debridement Selective Description: Pre-procedure Yes - 09:25 Verification/Time Out Taken: Start Time: 09:25 Pain Control: Other : lidocaine 4% Level: Non-Viable Tissue Total Area Debrided (L x 6 (cm) x 8 (cm) = 48 (cm) W): Tissue and other Non-Viable, Exudate, Fibrin/Slough material debrided: Instrument: Other : saline moistened gauze Bleeding: Minimum Hemostasis Achieved: Pressure End Time: 09:32 Procedural Pain: 0 Post Procedural Pain: 0 Response to Treatment:  Procedure was tolerated well Post Debridement Measurements of Total Wound Length: (cm) 6 Width: (cm) 8 Depth: (cm) 3 Volume: (cm) 113.097 Character of Wound/Ulcer Post Requires Further Debridement Debridement: Severity of Tissue Post Debridement: Fat layer exposed Post Procedure Diagnosis Same as Pre-procedure Electronic Signature(s) Signed: 11/30/2015 4:38:31 PM By: Evlyn KannerBritto, Clio Gerhart MD, FACS Signed: 11/30/2015 5:17:06 PM By: Elliot GurneyWoody, RN, BSN, Kim RN, BSN MillsKUEIDER, Fernando [name] ([id number]) Entered By: Elliot GurneyWoody, RN, BSN, Kim on 11/30/2015 09:40:54 Fernando [name], Duong ([id number]) -------------------------------------------------------------------------------- HPI Details Patient Name: Fernando [name], Fernando [name] Date of Service: 11/30/2015 8:45 AM Medical Record Number: [id number] Patient Account Number: [id number] Date of Birth/Sex: 02-Jan-1956 [age] y.o. Male) Treating RN: Huel CoventryWoody, Kim Primary Care Physician: Rolm GalaGrandis, Heidi Other Clinician: Referring Physician: Rolm GalaGrandis, Heidi Treating Physician/Extender: Rudene ReBritto, Jeriah Skufca Weeks in Treatment: 4 History of Present Illness Location: ulcers both lower extremities right worse than left Quality: Patient reports experiencing a dull pain to affected area(s). Severity: Patient states wound are getting worse. Duration: Patient has had the wound for > 3 years prior to seeking treatment at the wound center Timing: Pain in wound is Intermittent (comes and goes Context: The wound appeared gradually over time Modifying Factors: Consults to this date [facility] Associated Signs and Symptoms: Patient reports having increase swelling. HPI Description: 60 year old gentleman who has diabetes mellitus, been referred to was for use with lymphedema and peripheral vascular disease. Stand he's had a vascular workup ordered and has a scale opinion done recently. He is a smoker and smokes about 2 packs of cigarettes a day. past medical history significant  for coronary artery disease, CHF, COPD, hyperlipidemia, hypertension, lower extremity edema, obesity, nicotine addiction, status post chronically occluded right coronary artery, history of obstructive sleep apnea treated with a CPAP machine. he is status post hernia repair, tonsillectomy, appendectomy and angioplasty. Note is Made that he has been treated at the wound center for chronic lymphedema with ulceration which improves with Unna's but recurs when he does not have them. hemoglobin A1c was 6.6%. 11/09/2015 -- the patient has  not had his vascular tests so far and from what the wife tells me his compression wraps have been slipping and they need to be changed more frequently. He has several excuses for not stopping to smoke and we have address this again. 11/30/2015 -- the patient has lost home health coverage and hence his wife and he have to do the dressings. They do have some previously ordered compression wraps which they have been using in the past. Electronic Signature(s) Signed: 11/30/2015 9:32:25 AM By: Evlyn KannerBritto, Shakirah Kirkey MD, FACS Entered By: Evlyn KannerBritto, Lincy Belles on 11/30/2015 09:32:25 Fernando [name], Fernando [name] ([id number]) -------------------------------------------------------------------------------- Physical Exam Details Patient Name: Fernando [name], Fernando [name] Date of Service: 11/30/2015 8:45 AM Medical Record Number: [id number] Patient Account Number: [id number] Date of Birth/Sex: 01/21/1955 [age] y.o. Male) Treating RN: Huel CoventryWoody, Kim Primary Care Physician: Rolm GalaGrandis, Heidi Other Clinician: Referring Physician: Rolm GalaGrandis, Heidi Treating Physician/Extender: Rudene ReBritto, Aundra Pung Weeks in Treatment: 4 Constitutional . Pulse regular. Respirations normal and unlabored. Afebrile. . Eyes Nonicteric. Reactive to light. Ears, Nose, Mouth, and Throat Lips, teeth, and gums WNL.Marland Kitchen. Moist mucosa without lesions. Neck supple and nontender. No palpable supraclavicular or cervical adenopathy. Normal sized without  goiter. Respiratory WNL. No retractions.. Cardiovascular Pedal Pulses WNL. No clubbing, cyanosis or edema. Lymphatic No adneopathy. No adenopathy. No adenopathy. Musculoskeletal Adexa without tenderness or enlargement.. Digits and nails w/o clubbing, cyanosis, infection, petechiae, ischemia, or inflammatory conditions.. Integumentary (Hair, Skin) No suspicious lesions. No crepitus or fluctuance. No peri-wound warmth or erythema. No masses.Marland Kitchen. Psychiatric Judgement and insight Intact.. No evidence of depression, anxiety, or agitation.. Notes the lymphedema both lower extremities is looking better and the left lateral calf continues to improve and has several microperforations and minimal oozing. The right lateral calf continues to have a lot of subcutaneous debris which was washed out with moist saline gauze and most of it reveals a healthy tissue base. No sharp debridement was done today. Electronic Signature(s) Signed: 11/30/2015 9:33:14 AM By: Evlyn KannerBritto, Dessire Grimes MD, FACS Entered By: Evlyn KannerBritto, Tony Friscia on 11/30/2015 09:33:13 Fernando [name], Salam ([id number]) -------------------------------------------------------------------------------- Physician Orders Details Patient Name: Fernando [name], Justyce Date of Service: 11/30/2015 8:45 AM Medical Record Number: [id number] Patient Account Number: [id number] Date of Birth/Sex: 01/21/1955 [age] y.o. Male) Treating RN: Huel CoventryWoody, Kim Primary Care Physician: Rolm GalaGrandis, Heidi Other Clinician: Referring Physician: Rolm GalaGrandis, Heidi Treating Physician/Extender: Rudene ReBritto, Lukas Pelcher Weeks in Treatment: 4 Verbal / Phone Orders: Yes Clinician: Huel CoventryWoody, Kim Read Back and Verified: Yes Diagnosis Coding Wound Cleansing Wound #1 Right,Anterior Lower Leg o May shower with protection. o No tub bath. Wound #2 Left,Lateral Lower Leg o May shower with protection. o No tub bath. Anesthetic Wound #1 Right,Anterior Lower Leg o Topical Lidocaine 4% cream applied to wound bed prior  to debridement - for clinic use Wound #2 Left,Lateral Lower Leg o Topical Lidocaine 4% cream applied to wound bed prior to debridement - for clinic use Primary Wound Dressing Wound #1 Right,Anterior Lower Leg o Aquacel Ag Wound #2 Left,Lateral Lower Leg o Aquacel Ag Secondary Dressing Wound #1 Right,Anterior Lower Leg o ABD pad o XtraSorb Wound #2 Left,Lateral Lower Leg o ABD pad Dressing Change Frequency Wound #1 Right,Anterior Lower Leg o Three times weekly Wound #2 Left,Lateral Lower Leg Fernando [name], Marley ([id number]) o Three times weekly Follow-up Appointments Wound #1 Right,Anterior Lower Leg o Return Appointment in 1 week. Wound #2 Left,Lateral Lower Leg o Return Appointment in 1 week. Edema Control Wound #1 Right,Anterior Lower Leg o 3 Layer Compression System - Bilateral - unna to anchor o Elevate legs to the level of the heart and pump  ankles as often as possible Wound #2 Left,Lateral Lower Leg o Patient to wear own compression stockings o Elevate legs to the level of the heart and pump ankles as often as possible Additional Orders / Instructions Wound #1 Right,Anterior Lower Leg o Stop Smoking o Increase protein intake. Wound #2 Left,Lateral Lower Leg o Stop Smoking o Increase protein intake. Medications-please add to medication list. Wound #1 Right,Anterior Lower Leg o Other: - Vitamin A, Vitamin C, Zinc Wound #2 Left,Lateral Lower Leg o Other: - Vitamin A, Vitamin C, Zinc Electronic Signature(s) Signed: 11/30/2015 4:38:31 PM By: Evlyn Kanner MD, FACS Signed: 11/30/2015 5:17:06 PM By: Elliot Gurney, RN, BSN, Kim RN, BSN Entered By: Elliot Gurney, RN, BSN, Kim on 11/30/2015 09:25:49 Fernando Mannan ([id number]) -------------------------------------------------------------------------------- Problem List Details Patient Name: Fernando Mannan Date of Service: 11/30/2015 8:45 AM Medical Record Number: [id number] Patient Account  Number: [id number] Date of Birth/Sex: November 10, 1955 (60 y.o. Male) Treating RN: Huel Coventry Primary Care Physician: Rolm Gala Other Clinician: Referring Physician: Rolm Gala Treating Physician/Extender: Rudene Re in Treatment: 4 Active Problems ICD-10 Encounter Code Description Active Date Diagnosis E11.622 Type 2 diabetes mellitus with other skin ulcer 11/02/2015 Yes I89.0 Lymphedema, not elsewhere classified 11/02/2015 Yes E66.01 Morbid (severe) obesity due to excess calories 11/02/2015 Yes L97.222 Non-pressure chronic ulcer of left calf with fat layer 11/02/2015 Yes exposed L97.212 Non-pressure chronic ulcer of right calf with fat layer 11/02/2015 Yes exposed Inactive Problems Resolved Problems Electronic Signature(s) Signed: 11/30/2015 9:31:50 AM By: Evlyn Kanner MD, FACS Entered By: Evlyn Kanner on 11/30/2015 09:31:50 Fernando Mannan ([id number]) -------------------------------------------------------------------------------- Progress Note Details Patient Name: Fernando Mannan Date of Service: 11/30/2015 8:45 AM Medical Record Number: [id number] Patient Account Number: [id number] Date of Birth/Sex: 1955/09/16 (60 y.o. Male) Treating RN: Huel Coventry Primary Care Physician: Rolm Gala Other Clinician: Referring Physician: Rolm Gala Treating Physician/Extender: Rudene Re in Treatment: 4 Subjective Chief Complaint Information obtained from Patient Patient presents to the wound care center for a consult due non healing wound Both lower extremities with swelling and has had this for at least 4 years History of Present Illness (HPI) The following HPI elements were documented for the patient's wound: Location: ulcers both lower extremities right worse than left Quality: Patient reports experiencing a dull pain to affected area(s). Severity: Patient states wound are getting worse. Duration: Patient has had the wound for > 3 years prior to  seeking treatment at the wound center Timing: Pain in wound is Intermittent (comes and goes Context: The wound appeared gradually over time Modifying Factors: Consults to this date include:wound center at [facility] and cardiology Associated Signs and Symptoms: Patient reports having increase swelling. 60 year old gentleman who has diabetes mellitus, been referred to was for use with lymphedema and peripheral vascular disease. Stand he's had a vascular workup ordered and has a scale opinion done recently. He is a smoker and smokes about 2 packs of cigarettes a day. past medical history significant for coronary artery disease, CHF, COPD, hyperlipidemia, hypertension, lower extremity edema, obesity, nicotine addiction, status post chronically occluded right coronary artery, history of obstructive sleep apnea treated with a CPAP machine. he is status post hernia repair, tonsillectomy, appendectomy and angioplasty. Note is Made that he has been treated at the wound center for chronic lymphedema with ulceration which improves with Unna's but recurs when he does not have them. hemoglobin A1c was 6.6%. 11/09/2015 -- the patient has not had his vascular tests so far and from what the wife tells me his compression wraps have been slipping and  they need to be changed more frequently. He has several excuses for not stopping to smoke and we have address this again. 11/30/2015 -- the patient has lost home health coverage and hence his wife and he have to do the dressings. They do have some previously ordered compression wraps which they have been using in the past. SAURAV, CRUMBLE ([id number]) Objective Constitutional Pulse regular. Respirations normal and unlabored. Afebrile. Vitals Time Taken: 9:05 AM, Height: 64 in, Weight: 291 lbs, BMI: 49.9, Temperature: 98.2 F, Pulse: 88 bpm, Respiratory Rate: 20 breaths/min, Blood Pressure: 132/72 mmHg. Eyes Nonicteric. Reactive to light. Ears, Nose, Mouth, and  Throat Lips, teeth, and gums WNL.Marland Kitchen Moist mucosa without lesions. Neck supple and nontender. No palpable supraclavicular or cervical adenopathy. Normal sized without goiter. Respiratory WNL. No retractions.. Cardiovascular Pedal Pulses WNL. No clubbing, cyanosis or edema. Lymphatic No adneopathy. No adenopathy. No adenopathy. Musculoskeletal Adexa without tenderness or enlargement.. Digits and nails w/o clubbing, cyanosis, infection, petechiae, ischemia, or inflammatory conditions.Marland Kitchen Psychiatric Judgement and insight Intact.. No evidence of depression, anxiety, or agitation.. General Notes: the lymphedema both lower extremities is looking better and the left lateral calf continues to improve and has several microperforations and minimal oozing. The right lateral calf continues to have a lot of subcutaneous debris which was washed out with moist saline gauze and most of it reveals a healthy tissue base. No sharp debridement was done today. Integumentary (Hair, Skin) No suspicious lesions. No crepitus or fluctuance. No peri-wound warmth or erythema. No masses.. Wound #1 status is Open. Original cause of wound was Gradually Appeared. The wound is located on the Right,Anterior Lower Leg. The wound measures 6cm length x 8cm width x 0.2cm depth; 37.699cm^2 area and 7.54cm^3 volume. The wound is limited to skin breakdown. There is no tunneling or undermining noted. There is a medium amount of serous drainage noted. The wound margin is distinct with the outline attached to the wound base. There is no granulation within the wound bed. There is a large (67-100%) amount of necrotic tissue within the wound bed including Adherent Slough. The periwound skin appearance exhibited: ERSEL, ENSLIN ([id number]) Induration, Localized Edema, Scarring, Moist, Erythema. The surrounding wound skin color is noted with erythema which is circumferential. Periwound temperature was noted as No Abnormality. The  periwound has tenderness on palpation. Wound #2 status is Open. Original cause of wound was Gradually Appeared. The wound is located on the Left,Lateral Lower Leg. The wound measures 3.5cm length x 4cm width x 0.1cm depth; 10.996cm^2 area and 1.1cm^3 volume. The wound is limited to skin breakdown. There is no tunneling or undermining noted. There is a large amount of serous drainage noted. The wound margin is distinct with the outline attached to the wound base. There is large (67-100%) pink granulation within the wound bed. There is a small (1-33%) amount of necrotic tissue within the wound bed including Adherent Slough. The periwound skin appearance exhibited: Excoriation, Induration, Localized Edema, Dry/Scaly, Ecchymosis. The periwound skin appearance did not exhibit: Callus, Crepitus, Fluctuance, Friable, Rash, Maceration, Moist. Periwound temperature was noted as No Abnormality. The periwound has tenderness on palpation. Assessment Active Problems ICD-10 E11.622 - Type 2 diabetes mellitus with other skin ulcer I89.0 - Lymphedema, not elsewhere classified E66.01 - Morbid (severe) obesity due to excess calories L97.222 - Non-pressure chronic ulcer of left calf with fat layer exposed L97.212 - Non-pressure chronic ulcer of right calf with fat layer exposed Procedures Wound #1 Wound #1 is a Lymphedema located on the Right,Anterior Lower Leg .  There was a Non-Viable Tissue Open Wound/Selective [phone]) debridement with total area of 48 sq cm performed by Evlyn Kanner, MD. with the following instrument(s): saline moistened gauze to remove Non-Viable tissue/material including Exudate and Fibrin/Slough after achieving pain control using Other (lidocaine 4%). A time out was conducted at 09:25, prior to the start of the procedure. A Minimum amount of bleeding was controlled with Pressure. The procedure was tolerated well with a pain level of 0 throughout and a pain level of 0  following the procedure. Post Debridement Measurements: 6cm length x 8cm width x 3cm depth; 113.097cm^3 volume. Character of Wound/Ulcer Post Debridement requires further debridement. Severity of Tissue Post Debridement is: Fat layer exposed. Post procedure Diagnosis Wound #1: Same as Pre-Procedure BLASE, BECKNER ([id number]) Plan Wound Cleansing: Wound #1 Right,Anterior Lower Leg: May shower with protection. No tub bath. Wound #2 Left,Lateral Lower Leg: May shower with protection. No tub bath. Anesthetic: Wound #1 Right,Anterior Lower Leg: Topical Lidocaine 4% cream applied to wound bed prior to debridement - for clinic use Wound #2 Left,Lateral Lower Leg: Topical Lidocaine 4% cream applied to wound bed prior to debridement - for clinic use Primary Wound Dressing: Wound #1 Right,Anterior Lower Leg: Aquacel Ag Wound #2 Left,Lateral Lower Leg: Aquacel Ag Secondary Dressing: Wound #1 Right,Anterior Lower Leg: ABD pad XtraSorb Wound #2 Left,Lateral Lower Leg: ABD pad Dressing Change Frequency: Wound #1 Right,Anterior Lower Leg: Three times weekly Wound #2 Left,Lateral Lower Leg: Three times weekly Follow-up Appointments: Wound #1 Right,Anterior Lower Leg: Return Appointment in 1 week. Wound #2 Left,Lateral Lower Leg: Return Appointment in 1 week. Edema Control: Wound #1 Right,Anterior Lower Leg: 3 Layer Compression System - Bilateral - unna to anchor Elevate legs to the level of the heart and pump ankles as often as possible Wound #2 Left,Lateral Lower Leg: Patient to wear own compression stockings Elevate legs to the level of the heart and pump ankles as often as possible Additional Orders / Instructions: Wound #1 Right,Anterior Lower Leg: Stop Smoking Increase protein intake. Wound #2 Left,Lateral Lower Leg: ELIODORO, GULLETT ([id number]) Stop Smoking Increase protein intake. Medications-please add to medication list.: Wound #1 Right,Anterior Lower Leg: Other:  - Vitamin A, Vitamin C, Zinc Wound #2 Left,Lateral Lower Leg: Other: - Vitamin A, Vitamin C, Zinc I have recommended: 1. silver alginate and a Profore 3 layer compression to the right lower extremity. The left side he will use his compression wrap with silver alginate and change this for a total of 3 times a week. 2. Elevation and exercise 3. completly giving up smoking and I have spent time discussing this again. He is vehemently opposed to giving up smoking. 4. arterial and venous duplex studies have already been scheduled -- appointment still pending. 5. Control of his diabetes mellitus and CHF 6. regular visits to the wound center Electronic Signature(s) Signed: 11/30/2015 4:06:45 PM By: Evlyn Kanner MD, FACS Previous Signature: 11/30/2015 9:34:15 AM Version By: Evlyn Kanner MD, FACS Entered By: Evlyn Kanner on 11/30/2015 16:06:45 Fernando Mannan ([id number]) -------------------------------------------------------------------------------- SuperBill Details Patient Name: Fernando Mannan Date of Service: 11/30/2015 Medical Record Number: [id number] Patient Account Number: [id number] Date of Birth/Sex: February 21, 1955 (60 y.o. Male) Treating RN: Huel Coventry Primary Care Physician: Rolm Gala Other Clinician: Referring Physician: Rolm Gala Treating Physician/Extender: Rudene Re in Treatment: 4 Diagnosis Coding ICD-10 Codes Code Description [phone] Type 2 diabetes mellitus with other skin ulcer I89.0 Lymphedema, not elsewhere classified E66.01 Morbid (severe) obesity due to excess calories L97.222 Non-pressure chronic ulcer of left calf with fat  layer exposed L97.212 Non-pressure chronic ulcer of right calf with fat layer exposed Facility Procedures CPT4: Description Modifier Quantity Code [id number] (Facility Use Only) [phone] - APPLY MULTLAY COMPRS LWR RT 1 LEG Physician Procedures CPT4 Code: [id number] Description: 99213 - WC PHYS LEVEL 3 - EST PT ICD-10  Description Diagnosis E11.622 Type 2 diabetes mellitus with other skin ulcer I89.0 Lymphedema, not elsewhere classified E66.01 Morbid (severe) obesity due to excess calories L97.222 Non-pressure  chronic ulcer of left calf with fat Modifier: layer exposed Quantity: 1 Electronic Signature(s) Signed: 11/30/2015 4:38:31 PM By: Evlyn Kanner MD, FACS Signed: 11/30/2015 5:17:06 PM By: Elliot Gurney RN, BSN, Kim RN, BSN Previous Signature: 11/30/2015 9:34:28 AM Version By: Evlyn Kanner MD, FACS Entered By: Elliot Gurney RN, BSN, Kim on 11/30/2015 10:45:49

## 2015-12-01 NOTE — Progress Notes (Signed)
Fernando [name], Fernando [name] ([id number]) Visit Report for 11/30/2015 Arrival Information Details Patient Name: [name], Fernando Date of Service: 11/30/2015 8:45 AM Medical Record Number: [id number] Patient Account Number: [id number] Date of Birth/Sex: January 07, 1955 (61 y.o. Male) Treating RN: Huel Coventry Primary Care Physician: Rolm Gala Other Clinician: Referring Physician: Rolm Gala Treating Physician/Extender: Rudene Re in Treatment: 4 Visit Information History Since Last Visit Added or deleted any medications: No Patient Arrived: Ambulatory Any new allergies or adverse reactions: No Arrival Time: 09:03 Had a fall or experienced change in No Accompanied By: wife activities of daily living that may affect Transfer Assistance: None risk of falls: Patient Identification Verified: Yes Signs or symptoms of abuse/neglect since last No Secondary Verification Process Yes visito Completed: Has Dressing in Place as Prescribed: No Patient Requires Transmission- No Has Compression in Place as Prescribed: No Based Precautions: Pain Present Now: No Patient Has Alerts: Yes Patient Alerts: DMII ABI Katie RIGHT >220 Electronic Signature(s) Signed: 11/30/2015 5:17:06 PM By: Elliot Gurney, RN, BSN, Kim RN, BSN Entered By: Elliot Gurney, RN, BSN, Kim on 11/30/2015 09:05:44 Fernando [name] ([id number]) -------------------------------------------------------------------------------- Encounter Discharge Information Details Patient Name: Fernando [name] Date of Service: 11/30/2015 8:45 AM Medical Record Number: [id number] Patient Account Number: [id number] Date of Birth/Sex: 1955/10/18 (60 y.o. Male) Treating RN: Huel Coventry Primary Care Physician: Rolm Gala Other Clinician: Referring Physician: Rolm Gala Treating Physician/Extender: Rudene Re in Treatment: 4 Encounter Discharge Information Items Discharge Pain Level: 0 Discharge Condition: Stable Ambulatory Status:  Ambulatory Discharge Destination: Home Transportation: Private Auto Accompanied By: self Schedule Follow-up Appointment: Yes Medication Reconciliation completed and provided to Patient/Care Yes Addyson Traub: Provided on Clinical Summary of Care: 11/30/2015 Form Type Recipient Paper Patient SK Electronic Signature(s) Signed: 11/30/2015 5:17:06 PM By: Elliot Gurney, RN, BSN, Kim RN, BSN Previous Signature: 11/30/2015 9:37:17 AM Version By: Gwenlyn Perking Entered By: Elliot Gurney RN, BSN, Kim on 11/30/2015 09:41:49 Fernando [name] ([id number]) -------------------------------------------------------------------------------- Lower Extremity Assessment Details Patient Name: Fernando [name] Date of Service: 11/30/2015 8:45 AM Medical Record Number: [id number] Patient Account Number: [id number] Date of Birth/Sex: 14-Jan-1955 (60 y.o. Male) Treating RN: Huel Coventry Primary Care Physician: Rolm Gala Other Clinician: Referring Physician: Rolm Gala Treating Physician/Extender: Rudene Re in Treatment: 4 Edema Assessment Assessed: [Left: No] [Right: No] E[Left: dema] [Right: :] Calf Left: Right: Point of Measurement: 35 cm From Medial Instep 48 cm 48.5 cm Ankle Left: Right: Point of Measurement: 10 cm From Medial Instep 25.5 cm 25 cm Vascular Assessment Pulses: Posterior Tibial Dorsalis Pedis Palpable: [Left:Yes] [Right:Yes] Extremity colors, hair growth, and conditions: Extremity Color: [Left:Hyperpigmented] [Right:Hyperpigmented] Hair Growth on Extremity: [Left:Yes] [Right:Yes] Temperature of Extremity: [Left:Warm] [Right:Warm] Capillary Refill: [Left:> 3 seconds] [Right:< 3 seconds] Dependent Rubor: [Left:No] [Right:No] Blanched when Elevated: [Left:No] [Right:No] Lipodermatosclerosis: [Left:No] [Right:No] Toe Nail Assessment Left: Right: Thick: No No Discolored: No No Deformed: No No Improper Length and Hygiene: Yes Yes Electronic Signature(s) Signed: 11/30/2015 5:17:06  PM By: Elliot Gurney, RN, BSN, Kim RN, BSN Lynd, [place] ([id number]) Entered By: Elliot Gurney, RN, BSN, Kim on 11/30/2015 09:09:11 Fernando [name] ([id number]) -------------------------------------------------------------------------------- Multi Wound Chart Details Patient Name: Fernando [name] Date of Service: 11/30/2015 8:45 AM Medical Record Number: [id number] Patient Account Number: [id number] Date of Birth/Sex: March 28, 1955 (60 y.o. Male) Treating RN: Huel Coventry Primary Care Physician: Rolm Gala Other Clinician: Referring Physician: Rolm Gala Treating Physician/Extender: Rudene Re in Treatment: 4 Vital Signs Height(in): 64 Pulse(bpm): 88 Weight(lbs): 291 Blood Pressure 132/72 (mmHg): Body Mass Index(BMI): 50 Temperature(F): 98.2 Respiratory Rate 20 (breaths/min): Photos: [N/A:N/A] Wound Location: Right Lower Leg - Anterior  Left Lower Leg - Lateral N/A Wounding Event: Gradually Appeared Gradually Appeared N/A Primary Etiology: Lymphedema Lymphedema N/A Secondary Etiology: Venous Leg Ulcer Venous Leg Ulcer N/A Comorbid History: Lymphedema, Chronic Lymphedema, Chronic N/A Obstructive Pulmonary Obstructive Pulmonary Disease (COPD), Sleep Disease (COPD), Sleep Apnea, Congestive Heart Apnea, Congestive Heart Failure, Hypertension, Failure, Hypertension, Peripheral Arterial Peripheral Arterial Disease, Type II Diabetes Disease, Type II Diabetes Date Acquired: 07/03/2013 07/03/2013 N/A Weeks of Treatment: 4 4 N/A Wound Status: Open Open N/A Measurements L x W x D 6x8x0.2 3.5x4x0.1 N/A (cm) Area (cm) : 37.699 10.996 N/A Volume (cm) : 7.54 1.1 N/A % Reduction in Area: -4.80% 56.90% N/A % Reduction in Volume: -4.80% 56.90% N/A Classification: Full Thickness Without Partial Thickness N/A Exposed Support Structures HBO Classification: Grade 1 Grade 1 N/A Exudate Amount: Medium Large N/A ROLLO, FARQUHAR ([id number]) Exudate Type: Serous Serous N/A Exudate Color:  amber amber N/A Wound Margin: Distinct, outline attached Distinct, outline attached N/A Granulation Amount: None Present (0%) Large (67-100%) N/A Granulation Quality: N/A Pink N/A Necrotic Amount: Large (67-100%) Small (1-33%) N/A Exposed Structures: Fascia: No Fascia: No N/A Fat: No Fat: No Tendon: No Tendon: No Muscle: No Muscle: No Joint: No Joint: No Bone: No Bone: No Limited to Skin Limited to Skin Breakdown Breakdown Epithelialization: None Small (1-33%) N/A Periwound Skin Texture: Edema: Yes Edema: Yes N/A Induration: Yes Excoriation: Yes Scarring: Yes Induration: Yes Callus: No Crepitus: No Fluctuance: No Friable: No Rash: No Periwound Skin Moist: Yes Dry/Scaly: Yes N/A Moisture: Maceration: No Moist: No Periwound Skin Color: Erythema: Yes Ecchymosis: Yes N/A Erythema Location: Circumferential N/A N/A Temperature: No Abnormality No Abnormality N/A Tenderness on Yes Yes N/A Palpation: Wound Preparation: Ulcer Cleansing: Ulcer Cleansing: N/A Rinsed/Irrigated with Rinsed/Irrigated with Saline Saline Topical Anesthetic Topical Anesthetic Applied: Other: lidocaine Applied: None 4% Treatment Notes Electronic Signature(s) Signed: 11/30/2015 5:17:06 PM By: Elliot Gurney, RN, BSN, Kim RN, BSN Entered By: Elliot Gurney, RN, BSN, Kim on 11/30/2015 09:22:16 Fernando [name] ([id number]) -------------------------------------------------------------------------------- Multi-Disciplinary Care Plan Details Patient Name: Fernando [name] Date of Service: 11/30/2015 8:45 AM Medical Record Number: [id number] Patient Account Number: [id number] Date of Birth/Sex: [birth year] (60 y.o. Male) Treating RN: Huel Coventry Primary Care Physician: Rolm Gala Other Clinician: Referring Physician: Rolm Gala Treating Physician/Extender: Rudene Re in Treatment: 4 Active Inactive Abuse / Safety / Falls / Self Care Management Nursing Diagnoses: Impaired physical  mobility Potential for falls Goals: Patient will remain injury free Date Initiated: 11/02/2015 Goal Status: Active Interventions: Assess fall risk on admission and as needed Notes: Orientation to the Wound Care Program Nursing Diagnoses: Knowledge deficit related to the wound healing center program Goals: Patient/caregiver will verbalize understanding of the Wound Healing Center Program Date Initiated: 11/02/2015 Goal Status: Active Interventions: Provide education on orientation to the wound center Notes: Wound/Skin Impairment Nursing Diagnoses: Impaired tissue integrity Goals: Patient/caregiver will verbalize understanding of skin care regimen JOSE, CORVIN ([id number]) Date Initiated: 11/02/2015 Goal Status: Active Ulcer/skin breakdown will have a volume reduction of 30% by week 4 Date Initiated: 11/02/2015 Goal Status: Active Ulcer/skin breakdown will have a volume reduction of 50% by week 8 Date Initiated: 11/02/2015 Goal Status: Active Ulcer/skin breakdown will have a volume reduction of 80% by week 12 Date Initiated: 11/02/2015 Goal Status: Active Ulcer/skin breakdown will heal within 14 weeks Date Initiated: 11/02/2015 Goal Status: Active Interventions: Assess patient/caregiver ability to obtain necessary supplies Assess patient/caregiver ability to perform ulcer/skin care regimen upon admission and as needed Assess ulceration(s) every visit Notes: Electronic Signature(s) Signed: 11/30/2015 5:17:06 PM By:  Elliot GurneyWoody, RN, BSN, Radio producerKim RN, BSN Entered By: Elliot GurneyWoody, RN, BSN, Kim on 11/30/2015 09:12:31 Fernando MannanKUEIDER, Rogers ([id number]) -------------------------------------------------------------------------------- Pain Assessment Details Patient Name: Fernando MannanKUEIDER, Antinio Date of Service: 11/30/2015 8:45 AM Medical Record Number: [id number] Patient Account Number: [id number] Date of Birth/Sex: May 12, 1955 [age] y.o. Male) Treating RN: Huel CoventryWoody, Kim Primary Care Physician: Rolm GalaGrandis,  Heidi Other Clinician: Referring Physician: Rolm GalaGrandis, Heidi Treating Physician/Extender: Rudene ReBritto, Errol Weeks in Treatment: 4 Active Problems Location of Pain Severity and Description of Pain Patient Has Paino No Site Locations With Dressing Change: No Pain Management and Medication Current Pain Management: Electronic Signature(s) Signed: 11/30/2015 5:17:06 PM By: Elliot GurneyWoody, RN, BSN, Kim RN, BSN Entered By: Elliot GurneyWoody, RN, BSN, Kim on 11/30/2015 09:05:50 Fernando MannanKUEIDER, Alif ([id number]) -------------------------------------------------------------------------------- Patient/Caregiver Education Details Patient Name: Fernando MannanKUEIDER, Jax Date of Service: 11/30/2015 8:45 AM Medical Record Number: [id number] Patient Account Number: [id number] Date of Birth/Gender: May 12, 1955 [age] y.o. Male) Treating RN: Huel CoventryWoody, Kim Primary Care Physician: Rolm GalaGrandis, Heidi Other Clinician: Referring Physician: Rolm GalaGrandis, Heidi Treating Physician/Extender: Rudene ReBritto, Errol Weeks in Treatment: 4 Education Assessment Education Provided To: Patient Education Topics Provided Venous: Handouts: Controlling Swelling with Compression Stockings , Other: wear compression stockings daily Wound/Skin Impairment: Handouts: Caring for Your Ulcer, Other: wound care as prescribed Methods: Demonstration Responses: State content correctly Electronic Signature(s) Signed: 11/30/2015 5:17:06 PM By: Elliot GurneyWoody, RN, BSN, Kim RN, BSN Entered By: Elliot GurneyWoody, RN, BSN, Kim on 11/30/2015 09:42:45 Fernando MannanKUEIDER, Omarie ([id number]) -------------------------------------------------------------------------------- Wound Assessment Details Patient Name: Fernando MannanKUEIDER, Najee Date of Service: 11/30/2015 8:45 AM Medical Record Number: [id number] Patient Account Number: [id number] Date of Birth/Sex: May 12, 1955 [age] y.o. Male) Treating RN: Huel CoventryWoody, Kim Primary Care Physician: Rolm GalaGrandis, Heidi Other Clinician: Referring Physician: Rolm GalaGrandis, Heidi Treating Physician/Extender: Rudene ReBritto,  Errol Weeks in Treatment: 4 Wound Status Wound Number: 1 Primary Lymphedema Etiology: Wound Location: Right Lower Leg - Anterior Secondary Venous Leg Ulcer Wounding Event: Gradually Appeared Etiology: Date Acquired: 07/03/2013 Wound Open Weeks Of Treatment: 4 Status: Clustered Wound: No Comorbid Lymphedema, Chronic Obstructive History: Pulmonary Disease (COPD), Sleep Apnea, Congestive Heart Failure, Hypertension, Peripheral Arterial Disease, Type II Diabetes Photos Wound Measurements Length: (cm) 6 Width: (cm) 8 Depth: (cm) 0.2 Area: (cm) 37.699 Volume: (cm) 7.54 % Reduction in Area: -4.8% % Reduction in Volume: -4.8% Epithelialization: None Tunneling: No Undermining: No Wound Description Full Thickness Without Classification: Exposed Support Structures Diabetic Severity Grade 1 (Wagner): Wound Margin: Distinct, outline attached Exudate Amount: Medium Exudate Type: Serous Exudate Color: amber Foul Odor After Cleansing: No Wound Bed Fernando MannanKUEIDER, Zachari ([id number]) Granulation Amount: None Present (0%) Exposed Structure Necrotic Amount: Large (67-100%) Fascia Exposed: No Necrotic Quality: Adherent Slough Fat Layer Exposed: No Tendon Exposed: No Muscle Exposed: No Joint Exposed: No Bone Exposed: No Limited to Skin Breakdown Periwound Skin Texture Texture Color No Abnormalities Noted: No No Abnormalities Noted: No Induration: Yes Erythema: Yes Localized Edema: Yes Erythema Location: Circumferential Scarring: Yes Temperature / Pain Moisture Temperature: No Abnormality No Abnormalities Noted: No Tenderness on Palpation: Yes Moist: Yes Wound Preparation Ulcer Cleansing: Rinsed/Irrigated with Saline Topical Anesthetic Applied: Other: lidocaine 4%, Treatment Notes Wound #1 (Right, Anterior Lower Leg) 1. Cleansed with: Clean wound with Normal Saline 2. Anesthetic Topical Lidocaine 4% cream to wound bed prior to debridement 4. Dressing  Applied: Aquacel Ag 5. Secondary Dressing Applied ABD Pad 7. Secured with Tape 3 Layer Compression System - Right Lower Extremity Patient to wear own compression stockings Electronic Signature(s) Signed: 11/30/2015 5:17:06 PM By: Elliot GurneyWoody, RN, BSN, Kim RN, BSN Entered By: Elliot GurneyWoody, RN, BSN, Kim on 11/30/2015 09:10:51 Fernando MannanKUEIDER, Shaw ([id number]) -------------------------------------------------------------------------------- Wound  Assessment Details Patient Name: Fernando MannanKUEIDER, Tarvares Date of Service: 11/30/2015 8:45 AM Medical Record Number: [id number] Patient Account Number: [id number] Date of Birth/Sex: 19-Dec-1955 [age] y.o. Male) Treating RN: Huel CoventryWoody, Kim Primary Care Physician: Rolm GalaGrandis, Heidi Other Clinician: Referring Physician: Rolm GalaGrandis, Heidi Treating Physician/Extender: Rudene ReBritto, Errol Weeks in Treatment: 4 Wound Status Wound Number: 2 Primary Lymphedema Etiology: Wound Location: Left Lower Leg - Lateral Secondary Venous Leg Ulcer Wounding Event: Gradually Appeared Etiology: Date Acquired: 07/03/2013 Wound Open Weeks Of Treatment: 4 Status: Clustered Wound: No Comorbid Lymphedema, Chronic Obstructive History: Pulmonary Disease (COPD), Sleep Apnea, Congestive Heart Failure, Hypertension, Peripheral Arterial Disease, Type II Diabetes Photos Wound Measurements Length: (cm) 3.5 Width: (cm) 4 Depth: (cm) 0.1 Area: (cm) 10.996 Volume: (cm) 1.1 % Reduction in Area: 56.9% % Reduction in Volume: 56.9% Epithelialization: Small (1-33%) Tunneling: No Undermining: No Wound Description Classification: Partial Thickness Diabetic Severity (Wagner): Grade 1 Wound Margin: Distinct, outline attach Exudate Amount: Large Exudate Type: Serous Exudate Color: amber Foul Odor After Cleansing: No ed Wound Bed Granulation Amount: Large (67-100%) Exposed Structure Granulation Quality: Pink Fascia Exposed: No Fernando MannanKUEIDER, Tab ([id number]) Necrotic Amount: Small (1-33%) Fat Layer Exposed:  No Necrotic Quality: Adherent Slough Tendon Exposed: No Muscle Exposed: No Joint Exposed: No Bone Exposed: No Limited to Skin Breakdown Periwound Skin Texture Texture Color No Abnormalities Noted: No No Abnormalities Noted: No Callus: No Ecchymosis: Yes Crepitus: No Temperature / Pain Excoriation: Yes Temperature: No Abnormality Fluctuance: No Tenderness on Palpation: Yes Friable: No Induration: Yes Localized Edema: Yes Rash: No Moisture No Abnormalities Noted: No Dry / Scaly: Yes Maceration: No Moist: No Wound Preparation Ulcer Cleansing: Rinsed/Irrigated with Saline Topical Anesthetic Applied: None Treatment Notes Wound #2 (Left, Lateral Lower Leg) 1. Cleansed with: Clean wound with Normal Saline 2. Anesthetic Topical Lidocaine 4% cream to wound bed prior to debridement 4. Dressing Applied: Aquacel Ag 5. Secondary Dressing Applied ABD Pad 7. Secured with Tape 3 Layer Compression System - Right Lower Extremity Patient to wear own compression stockings Electronic Signature(s) Signed: 11/30/2015 5:17:06 PM By: Elliot GurneyWoody, RN, BSN, Kim RN, BSN Entered By: Elliot GurneyWoody, RN, BSN, Kim on 11/30/2015 09:12:20 Fernando MannanKUEIDER, Little ([id number]) Fernando MannanKUEIDER, Wasim ([id number]) -------------------------------------------------------------------------------- Vitals Details Patient Name: Fernando MannanKUEIDER, Ayaansh Date of Service: 11/30/2015 8:45 AM Medical Record Number: [id number] Patient Account Number: [id number] Date of Birth/Sex: 19-Dec-1955 8(60 y.o. Male) Treating RN: Huel CoventryWoody, Kim Primary Care Physician: Rolm GalaGrandis, Heidi Other Clinician: Referring Physician: Rolm GalaGrandis, Heidi Treating Physician/Extender: Rudene ReBritto, Errol Weeks in Treatment: 4 Vital Signs Time Taken: 09:05 Temperature (F): 98.2 Height (in): 64 Pulse (bpm): 88 Weight (lbs): 291 Respiratory Rate (breaths/min): 20 Body Mass Index (BMI): 49.9 Blood Pressure (mmHg): 132/72 Reference Range: 80 - 120 mg / dl Electronic  Signature(s) Signed: 11/30/2015 5:17:06 PM By: Elliot GurneyWoody, RN, BSN, Kim RN, BSN Entered By: Elliot GurneyWoody, RN, BSN, Kim on 11/30/2015 09:06:14

## 2015-12-09 ENCOUNTER — Encounter (INDEPENDENT_AMBULATORY_CARE_PROVIDER_SITE_OTHER): Payer: Self-pay | Admitting: Vascular Surgery

## 2015-12-09 ENCOUNTER — Ambulatory Visit (INDEPENDENT_AMBULATORY_CARE_PROVIDER_SITE_OTHER): Payer: PPO

## 2015-12-09 ENCOUNTER — Ambulatory Visit (INDEPENDENT_AMBULATORY_CARE_PROVIDER_SITE_OTHER): Payer: PPO | Admitting: Vascular Surgery

## 2015-12-09 VITALS — BP 170/88 | HR 55 | Resp 19 | Ht 64.0 in | Wt 283.0 lb

## 2015-12-09 DIAGNOSIS — I890 Lymphedema, not elsewhere classified: Secondary | ICD-10-CM

## 2015-12-09 DIAGNOSIS — F17200 Nicotine dependence, unspecified, uncomplicated: Secondary | ICD-10-CM | POA: Diagnosis not present

## 2015-12-09 DIAGNOSIS — I739 Peripheral vascular disease, unspecified: Secondary | ICD-10-CM

## 2015-12-10 ENCOUNTER — Ambulatory Visit: Payer: PPO | Admitting: Surgery

## 2015-12-10 NOTE — Progress Notes (Signed)
Subjective:    Patient ID: Fernando [name], male    DOB: 1955/12/23, 60 y.o.   MRN: [id number] Chief Complaint  Patient presents with  . Re-evaluation    Ultrasound follow up   Patient presents to review vascular studies. He was originally seen on 10/28/15 as a new patient referred by Dr. Rushie GoltzFaith. Patient and wife (who was present at exam) were looking for new vascular surgeon and wound care doctor. Patient endorses a history of being diagnosed with "Lymphedema" 3.5 years ago. Since his diagnosis the patient has been dealing with intermittent exacerbation, weeping and ulcer formation on his bilateral lower extremities. States ulcers are usually located on the calves. He has been treated in the past with unna wraps and compression. At the time, we referred him to Dr. Meyer RusselBritto who has been treated him with wound care consisting of unna wraps, debridements etc on a regular basis. Today, he underwent an ABI which showed Right ABI: 1.21 and Left >1.3 (no previous for comparison). A bilateral venous duplex was notable for No DVT, No SVT and No Reflux.     Review of Systems Constitutional: Negative.   HENT: Negative.   Eyes: Negative.   Respiratory:       COPD  Cardiovascular: Positive for leg swelling (Bilateral).       CHF  Gastrointestinal: Negative.   Endocrine:       Pre-Diabetic  Genitourinary: Negative.   Musculoskeletal: Negative.   Skin: Positive for wound (Bilateral lower extremity ulcerations).  Allergic/Immunologic: Negative.   Neurological: Negative.   Hematological: Negative.   Psychiatric/Behavioral: Negative.    Objective:   Physical Exam Constitutional: He is oriented to person, place, and time. He appears well-developed and well-nourished.  Morbidly Obese  HENT:  Head: Normocephalic and atraumatic.  Eyes: Conjunctivae and EOM are normal. Pupils are equal, round, and reactive to light.  Neck: Normal range of motion.  Cardiovascular: Normal rate and regular rhythm.     Pulses:      Radial pulses are 2+ on the right side, and 2+ on the left side.  Hard to assess pedal pulses due to edema. No gangrenous changes or ulcers to toes.   Pulmonary/Chest: Effort normal. He has wheezes.  Abdominal: Soft. Bowel sounds are normal.  Musculoskeletal: Normal range of motion. He exhibits edema (Moderate 1+ pitting edema Bilaterally).  Neurological: He is alert and oriented to person, place, and time.  Skin:  Right Lower Extremity: Improvement in ulceration however still present. Decrease in size.  Left Lower Extremity: Improvement in ulceration however still present. Decrease in size.  Severe venous dermatitis noted. No cellulitis.   Psychiatric: He has a normal mood and affect. His behavior is normal. Judgment and thought content normal.   BP (!) 170/88 (BP Location: Right Arm)   Pulse (!) 55   Resp 19   Ht 5\' 4"  (1.626 m)   Wt 283 lb (128.4 kg)   BMI 48.58 kg/m   Past Medical History:  Diagnosis Date  . CAD (coronary artery disease)   . CHF (congestive heart failure) (HCC)   . COPD (chronic obstructive pulmonary disease) (HCC)   . Diabetes mellitus without complication (HCC)   . Hyperlipidemia   . Hypertension   . Lymphedema   . Sleep apnea   . Venous insufficiency    Social History   Social History  . Marital status: Married    Spouse name: N/A  . Number of children: N/A  . Years of education: N/A   Occupational  History  . Not on file.   Social History Main Topics  . Smoking status: Current Every Day Smoker    Packs/day: 2.00    Types: Cigarettes  . Smokeless tobacco: Current User  . Alcohol use No  . Drug use: No  . Sexual activity: Not on file   Other Topics Concern  . Not on file   Social History Narrative  . No narrative on file   Past Surgical History:  Procedure Laterality Date  . ADENOIDECTOMY    . ANGIOPLASTY    . APPENDECTOMY    . CARDIAC CATHETERIZATION    . HERNIA REPAIR    . TONSILLECTOMY     Family History   Problem Relation Age of Onset  . Cancer Father    Allergies  Allergen Reactions  . Augmentin [Amoxicillin-Pot Clavulanate] Hives  . Codeine Hives  . Other Rash    methialate      Assessment & Plan:  Patient presents to review vascular studies. He was originally seen on 10/28/15 as a new patient referred by Dr. Rushie GoltzFaith. Patient and wife (who was present at exam) were looking for new vascular surgeon and wound care doctor. Patient endorses a history of being diagnosed with "Lymphedema" 3.5 years ago. Since his diagnosis the patient has been dealing with intermittent exacerbation, weeping and ulcer formation on his bilateral lower extremities. States ulcers are usually located on the calves. He has been treated in the past with unna wraps and compression. At the time, we referred him to Dr. Meyer RusselBritto who has been treated him with wound care consisting of unna wraps, debridements etc on a regular basis. Today, he underwent an ABI which showed Right ABI: 1.21 and Left >1.3 (no previous for comparison). A bilateral venous duplex was notable for No DVT, No SVT and No Reflux.    1. Lymphedema - Stable Despite conservative treatments including exercise, elevation, compression, unna boot wraps and wound care by a wound physician the patient still presents with Stage 3 Lymphedema with Ulceration Bilaterally.  The patient would greatly benefit from additional therapy with a lymphedema pump.   2. Tobacco dependence - Stable I have discussed (approximately 5 minutes) with the patient the role of tobacco in the pathogenesis of atherosclerosis and its effect on the progression of the disease, impact on the durability of interventions and its limitations on the formation of collateral pathways. I have recommended absolute tobacco cessation. I have discussed various options available for assistance with tobacco cessation including over the counter methods (Nicotine gum, patch and lozenges). We also discussed  prescription options (Chantix, Nicotine Inhaler / Nasal Spray). The patient is not interested in pursuing any prescription tobacco cessation options at this time. The patient voices their understanding.   3. PAD (peripheral artery disease) (HCC) - Stable Asymptomatic. Acceptable ABI. No indication for intervention at this time. Follow up one year with ABI.   Current Outpatient Prescriptions on File Prior to Visit  Medication Sig Dispense Refill  . acetaminophen (TYLENOL) 500 MG tablet Take 1,500 mg by mouth every 6 (six) hours as needed. For  Pain.    Marland Kitchen. aspirin EC 81 MG tablet Take 81 mg by mouth daily.    . carvedilol (COREG) 3.125 MG tablet Take 3.125 mg by mouth 2 (two) times daily.    . ciprofloxacin (CIPRO) 500 MG tablet Take 1 tablet (500 mg total) by mouth 2 (two) times daily. 20 tablet 0  . clindamycin (CLEOCIN) 300 MG capsule Take 1 capsule (300 mg total)  by mouth 3 (three) times daily. 30 capsule 0  . cyanocobalamin (,VITAMIN B-12,) 1000 MCG/ML injection Inject 1,000 mcg into the muscle every 30 (thirty) days.    Marland Kitchen EPINEPHrine (EPIPEN 2-PAK) 0.3 mg/0.3 mL IJ SOAJ injection Inject 0.3 mg as directed daily as needed. For allergic reaction.    . gabapentin (NEURONTIN) 300 MG capsule as needed.    Marland Kitchen losartan (COZAAR) 50 MG tablet Take 50 mg by mouth daily.    . meloxicam (MOBIC) 15 MG tablet Take 15 mg by mouth daily as needed. With food for pain.    Marland Kitchen triamcinolone cream (KENALOG) 0.1 % Apply 1 application topically daily as needed (for leg rash.).     Marland Kitchen digoxin (LANOXIN) 0.125 MG tablet Take 125 mcg by mouth daily.    . potassium chloride (MICRO-K) 10 MEQ CR capsule Take 10 mEq by mouth daily.    Marland Kitchen tiotropium (SPIRIVA HANDIHALER) 18 MCG inhalation capsule Place 18 mcg into inhaler and inhale daily.     No current facility-administered medications on file prior to visit.    There are no Patient Instructions on file for this visit. Return in about 3 months (around 03/08/2016) for  Lymphedema Check.  Mariabella Nilsen A Deloma Spindle, PA-C

## 2015-12-18 ENCOUNTER — Encounter: Payer: PPO | Attending: Surgery | Admitting: Surgery

## 2015-12-18 DIAGNOSIS — I890 Lymphedema, not elsewhere classified: Secondary | ICD-10-CM | POA: Insufficient documentation

## 2015-12-18 DIAGNOSIS — I110 Hypertensive heart disease with heart failure: Secondary | ICD-10-CM | POA: Insufficient documentation

## 2015-12-18 DIAGNOSIS — E11622 Type 2 diabetes mellitus with other skin ulcer: Secondary | ICD-10-CM | POA: Insufficient documentation

## 2015-12-18 DIAGNOSIS — I2510 Atherosclerotic heart disease of native coronary artery without angina pectoris: Secondary | ICD-10-CM | POA: Diagnosis not present

## 2015-12-18 DIAGNOSIS — I509 Heart failure, unspecified: Secondary | ICD-10-CM | POA: Diagnosis not present

## 2015-12-18 DIAGNOSIS — G4733 Obstructive sleep apnea (adult) (pediatric): Secondary | ICD-10-CM | POA: Diagnosis not present

## 2015-12-18 DIAGNOSIS — J449 Chronic obstructive pulmonary disease, unspecified: Secondary | ICD-10-CM | POA: Diagnosis not present

## 2015-12-18 DIAGNOSIS — L97222 Non-pressure chronic ulcer of left calf with fat layer exposed: Secondary | ICD-10-CM | POA: Insufficient documentation

## 2015-12-18 DIAGNOSIS — L97221 Non-pressure chronic ulcer of left calf limited to breakdown of skin: Secondary | ICD-10-CM | POA: Diagnosis not present

## 2015-12-18 DIAGNOSIS — F17210 Nicotine dependence, cigarettes, uncomplicated: Secondary | ICD-10-CM | POA: Diagnosis not present

## 2015-12-18 DIAGNOSIS — Z6841 Body Mass Index (BMI) 40.0 and over, adult: Secondary | ICD-10-CM | POA: Diagnosis not present

## 2015-12-18 DIAGNOSIS — L97212 Non-pressure chronic ulcer of right calf with fat layer exposed: Secondary | ICD-10-CM | POA: Diagnosis not present

## 2015-12-18 DIAGNOSIS — L97811 Non-pressure chronic ulcer of other part of right lower leg limited to breakdown of skin: Secondary | ICD-10-CM | POA: Diagnosis not present

## 2015-12-19 NOTE — Progress Notes (Addendum)
[name], [name] ([id number]) Visit Report for 12/18/2015 Chief Complaint Document Details Patient Name: Fernando [name] Date of Service: 12/18/2015 9:45 AM Medical Record Number: [id number] Patient Account Number: [id number] Date of Birth/Sex: 1955-10-18 (60 y.o. Male) Treating RN: Phillis Haggis Primary Care Physician: Rolm Gala Other Clinician: Referring Physician: Rolm Gala Treating Physician/Extender: Rudene Re in Treatment: 6 Information Obtained from: Patient Chief Complaint Patient presents to the wound care center for a consult due non healing wound Both lower extremities with swelling and has had this for at least 4 years Electronic Signature(s) Signed: 12/18/2015 11:48:57 AM By: Evlyn Kanner MD, FACS Entered By: Evlyn Kanner on 12/18/2015 11:48:57 Fernando [name] ([id number]) -------------------------------------------------------------------------------- HPI Details Patient Name: Fernando [name] Date of Service: 12/18/2015 9:45 AM Medical Record Number: [id number] Patient Account Number: [id number] Date of Birth/Sex: 1955-05-04 (60 y.o. Male) Treating RN: Phillis Haggis Primary Care Physician: Rolm Gala Other Clinician: Referring Physician: Rolm Gala Treating Physician/Extender: Rudene Re in Treatment: 6 History of Present Illness Location: ulcers both lower extremities right worse than left Quality: Patient reports experiencing a dull pain to affected area(s). Severity: Patient states wound are getting worse. Duration: Patient has had the wound for > 3 years prior to seeking treatment at the wound center Timing: Pain in wound is Intermittent (comes and goes Context: The wound appeared gradually over time Modifying Factors: Consults to this date include:wound center at [facility] and cardiology Associated Signs and Symptoms: Patient reports having increase swelling. HPI Description: 60 year old gentleman who has diabetes  mellitus, been referred to was for use with lymphedema and peripheral vascular disease. Stand he's had a vascular workup ordered and has a scale opinion done recently. He is a smoker and smokes about 2 packs of cigarettes a day. past medical history significant for coronary artery disease, CHF, COPD, hyperlipidemia, hypertension, lower extremity edema, obesity, nicotine addiction, status post chronically occluded right coronary artery, history of obstructive sleep apnea treated with a CPAP machine. he is status post hernia repair, tonsillectomy, appendectomy and angioplasty. Note is Made that he has been treated at the wound center for chronic lymphedema with ulceration which improves with Unna's but recurs when he does not have them. hemoglobin A1c was 6.6%. 11/09/2015 -- the patient has not had his vascular tests so far and from what the wife tells me his compression wraps have been slipping and they need to be changed more frequently. He has several excuses for not stopping to smoke and we have address this again. 11/30/2015 -- the patient has lost home health coverage and hence his wife and he have to do the dressings. They do have some previously ordered compression wraps which they have been using in the past. 12/18/2015 -- patient has had recent venous duplex examination done which shows no DVT no SVT or no reflux The arterial study done showed a right ABI of 1.21 and the left of 1.3 which was consistent with noncompressible arteries due to medial calcification. The left great toe pressure and PPG waveform are decreased and the right great toe pressure and PPG waveforms are within normal limits. The right toe brachial index was 1.0 and the left was 0.68. he was seen by the PA at the vascular office Ms. Cleda Daub -- who recommended lymphedema pumps for the stage III lymphedema, cessation of smoking and as far as the peripheral arterial disease goes she recommended no  intervention at this time as the ABIs were acceptable and to follow-up in a year with ABI. ERRIC, MACHNIK ([id number]) Electronic Signature(s)  Signed: 12/18/2015 11:49:41 AM By: Evlyn Kanner MD, FACS Previous Signature: 12/18/2015 10:42:08 AM Version By: Evlyn Kanner MD, FACS Previous Signature: 12/18/2015 10:32:10 AM Version By: Evlyn Kanner MD, FACS Entered By: Evlyn Kanner on 12/18/2015 11:49:41 Fernando [name] ([id number]) -------------------------------------------------------------------------------- Physical Exam Details Patient Name: Fernando [name] Date of Service: 12/18/2015 9:45 AM Medical Record Number: [id number] Patient Account Number: [id number] Date of Birth/Sex: 1955-05-01 (60 y.o. Male) Treating RN: Phillis Haggis Primary Care Physician: Rolm Gala Other Clinician: Referring Physician: Rolm Gala Treating Physician/Extender: Rudene Re in Treatment: 6 Constitutional . Pulse regular. Respirations normal and unlabored. Afebrile. . Eyes Nonicteric. Reactive to light. Ears, Nose, Mouth, and Throat Lips, teeth, and gums WNL.Marland Kitchen Moist mucosa without lesions. Neck supple and nontender. No palpable supraclavicular or cervical adenopathy. Normal sized without goiter. Respiratory WNL. No retractions.. Cardiovascular Pedal Pulses WNL. No clubbing, cyanosis or edema. Lymphatic No adneopathy. No adenopathy. No adenopathy. Musculoskeletal Adexa without tenderness or enlargement.. Digits and nails w/o clubbing, cyanosis, infection, petechiae, ischemia, or inflammatory conditions.. Integumentary (Hair, Skin) No suspicious lesions. No crepitus or fluctuance. No peri-wound warmth or erythema. No masses.Marland Kitchen Psychiatric Judgement and insight Intact.. No evidence of depression, anxiety, or agitation.. Notes the patient's lymphedema persist and he has got a bit of significant ulceration right worse than left. No sharp debridement was required  today. Electronic Signature(s) Signed: 12/18/2015 11:50:34 AM By: Evlyn Kanner MD, FACS Entered By: Evlyn Kanner on 12/18/2015 11:50:33 Fernando [name] ([id number]) -------------------------------------------------------------------------------- Physician Orders Details Patient Name: Fernando [name] Date of Service: 12/18/2015 9:45 AM Medical Record Number: [id number] Patient Account Number: [id number] Date of Birth/Sex: 03/25/1955 (60 y.o. Male) Treating RN: Phillis Haggis Primary Care Physician: Rolm Gala Other Clinician: Referring Physician: Rolm Gala Treating Physician/Extender: Rudene Re in Treatment: 6 Verbal / Phone Orders: Yes ClinicianAshok Cordia, Debi Read Back and Verified: Yes Diagnosis Coding Wound Cleansing Wound #1 Right,Anterior Lower Leg o Clean wound with Normal Saline. o Cleanse wound with mild soap and water o May Shower, gently pat wound dry prior to applying new dressing. Wound #2 Left,Lateral Lower Leg o Clean wound with Normal Saline. o Cleanse wound with mild soap and water o May Shower, gently pat wound dry prior to applying new dressing. Anesthetic Wound #1 Right,Anterior Lower Leg o Topical Lidocaine 4% cream applied to wound bed prior to debridement - for clinic use Wound #2 Left,Lateral Lower Leg o Topical Lidocaine 4% cream applied to wound bed prior to debridement - for clinic use Primary Wound Dressing Wound #1 Right,Anterior Lower Leg o Aquacel Ag Wound #2 Left,Lateral Lower Leg o Aquacel Ag Secondary Dressing Wound #1 Right,Anterior Lower Leg o ABD pad o Conform/Kerlix Wound #2 Left,Lateral Lower Leg o ABD pad o Kerlix and Coban Dressing Change Frequency Wound #1 Right,Anterior Lower Leg Teachey, Jeannett Senior ([id number]) o Change dressing every other day. Wound #2 Left,Lateral Lower Leg o Change dressing every other day. Follow-up Appointments Wound #1 Right,Anterior Lower  Leg o Return Appointment in 1 week. Wound #2 Left,Lateral Lower Leg o Return Appointment in 1 week. Edema Control Wound #1 Right,Anterior Lower Leg o Elevate legs to the level of the heart and pump ankles as often as possible o Other: - Wear your Juxtalites Wound #2 Left,Lateral Lower Leg o Elevate legs to the level of the heart and pump ankles as often as possible o Other: - Wear your Juxtalites Additional Orders / Instructions Wound #1 Right,Anterior Lower Leg o Stop Smoking o Increase protein intake. Wound #2 Left,Lateral Lower Leg o Stop Smoking o  Increase protein intake. Medications-please add to medication list. Wound #1 Right,Anterior Lower Leg o Other: - Vitamin A, Vitamin C, Zinc Wound #2 Left,Lateral Lower Leg o Other: - Vitamin A, Vitamin C, Zinc Electronic Signature(s) Signed: 12/18/2015 3:52:57 PM By: Evlyn Kanner MD, FACS Signed: 12/18/2015 4:21:45 PM By: Alejandro Mulling Entered By: Alejandro Mulling on 12/18/2015 10:40:02 Fernando [name] ([id number]) -------------------------------------------------------------------------------- Problem List Details Patient Name: Fernando [name] Date of Service: 12/18/2015 9:45 AM Medical Record Number: [id number] Patient Account Number: [id number] Date of Birth/Sex: 29-Sep-1955 (60 y.o. Male) Treating RN: Phillis Haggis Primary Care Physician: Rolm Gala Other Clinician: Referring Physician: Rolm Gala Treating Physician/Extender: Rudene Re in Treatment: 6 Active Problems ICD-10 Encounter Code Description Active Date Diagnosis E11.622 Type 2 diabetes mellitus with other skin ulcer 11/02/2015 Yes I89.0 Lymphedema, not elsewhere classified 11/02/2015 Yes E66.01 Morbid (severe) obesity due to excess calories 11/02/2015 Yes L97.222 Non-pressure chronic ulcer of left calf with fat layer 11/02/2015 Yes exposed L97.212 Non-pressure chronic ulcer of right calf with fat layer  11/02/2015 Yes exposed Inactive Problems Resolved Problems Electronic Signature(s) Signed: 12/18/2015 11:48:39 AM By: Evlyn Kanner MD, FACS Entered By: Evlyn Kanner on 12/18/2015 11:48:39 Fernando [name] ([id number]) -------------------------------------------------------------------------------- Progress Note Details Patient Name: Fernando [name] Date of Service: 12/18/2015 9:45 AM Medical Record Number: [id number] Patient Account Number: [id number] Date of Birth/Sex: August 14, 1955 (60 y.o. Male) Treating RN: Phillis Haggis Primary Care Physician: Rolm Gala Other Clinician: Referring Physician: Rolm Gala Treating Physician/Extender: Rudene Re in Treatment: 6 Subjective Chief Complaint Information obtained from Patient Patient presents to the wound care center for a consult due non healing wound Both lower extremities with swelling and has had this for at least 4 years History of Present Illness (HPI) The following HPI elements were documented for the patient's wound: Location: ulcers both lower extremities right worse than left Quality: Patient reports experiencing a dull pain to affected area(s). Severity: Patient states wound are getting worse. Duration: Patient has had the wound for > 3 years prior to seeking treatment at the wound center Timing: Pain in wound is Intermittent (comes and goes Context: The wound appeared gradually over time Modifying Factors: Consults to this date include:wound center at [facility] and cardiology Associated Signs and Symptoms: Patient reports having increase swelling. 60 year old gentleman who has diabetes mellitus, been referred to was for use with lymphedema and peripheral vascular disease. Stand he's had a vascular workup ordered and has a scale opinion done recently. He is a smoker and smokes about 2 packs of cigarettes a day. past medical history significant for coronary artery disease, CHF, COPD, hyperlipidemia,  hypertension, lower extremity edema, obesity, nicotine addiction, status post chronically occluded right coronary artery, history of obstructive sleep apnea treated with a CPAP machine. he is status post hernia repair, tonsillectomy, appendectomy and angioplasty. Note is Made that he has been treated at the wound center for chronic lymphedema with ulceration which improves with Unna's but recurs when he does not have them. hemoglobin A1c was 6.6%. 11/09/2015 -- the patient has not had his vascular tests so far and from what the wife tells me his compression wraps have been slipping and they need to be changed more frequently. He has several excuses for not stopping to smoke and we have address this again. 11/30/2015 -- the patient has lost home health coverage and hence his wife and he have to do the dressings. They do have some previously ordered compression wraps which they have been using in the past. 12/18/2015 -- patient has had recent  venous duplex examination done which shows no DVT no SVT or no reflux The arterial study done showed a right ABI of 1.21 and the left of 1.3 which was consistent with noncompressible arteries due to medial calcification. The left great toe pressure and PPG waveform are Bjelland, Brittin ([id number]) decreased and the right great toe pressure and PPG waveforms are within normal limits. The right toe brachial index was 1.0 and the left was 0.68. he was seen by the PA at the vascular office Ms. Cleda DaubKimberly Stegmayer -- who recommended lymphedema pumps for the stage III lymphedema, cessation of smoking and as far as the peripheral arterial disease goes she recommended no intervention at this time as the ABIs were acceptable and to follow-up in a year with ABI. Objective Constitutional Pulse regular. Respirations normal and unlabored. Afebrile. Vitals Time Taken: 10:07 AM, Height: 64 in, Weight: 291 lbs, BMI: 49.9, Temperature: 97.6 F, Pulse: 61 bpm,  Respiratory Rate: 22 breaths/min, Blood Pressure: 131/60 mmHg. Eyes Nonicteric. Reactive to light. Ears, Nose, Mouth, and Throat Lips, teeth, and gums WNL.Marland Kitchen. Moist mucosa without lesions. Neck supple and nontender. No palpable supraclavicular or cervical adenopathy. Normal sized without goiter. Respiratory WNL. No retractions.. Cardiovascular Pedal Pulses WNL. No clubbing, cyanosis or edema. Lymphatic No adneopathy. No adenopathy. No adenopathy. Musculoskeletal Adexa without tenderness or enlargement.. Digits and nails w/o clubbing, cyanosis, infection, petechiae, ischemia, or inflammatory conditions.Marland Kitchen. Psychiatric Judgement and insight Intact.. No evidence of depression, anxiety, or agitation.. General Notes: the patient's lymphedema persist and he has got a bit of significant ulceration right worse than left. No sharp debridement was required today. Fernando MannanKUEIDER, Blase ([id number]) Integumentary (Hair, Skin) No suspicious lesions. No crepitus or fluctuance. No peri-wound warmth or erythema. No masses.. Wound #1 status is Open. Original cause of wound was Gradually Appeared. The wound is located on the Right,Anterior Lower Leg. The wound measures 7.7cm length x 9.5cm width x 0.2cm depth; 57.452cm^2 area and 11.49cm^3 volume. The wound is limited to skin breakdown. There is no tunneling or undermining noted. There is a large amount of serous drainage noted. The wound margin is distinct with the outline attached to the wound base. There is medium (34-66%) red granulation within the wound bed. There is a medium (34-66%) amount of necrotic tissue within the wound bed including Adherent Slough. The periwound skin appearance exhibited: Induration, Localized Edema, Scarring, Moist, Erythema. The surrounding wound skin color is noted with erythema which is circumferential. Periwound temperature was noted as No Abnormality. The periwound has tenderness on palpation. Wound #2 status is Open.  Original cause of wound was Gradually Appeared. The wound is located on the Left,Lateral Lower Leg. The wound measures 2cm length x 7cm width x 0.1cm depth; 10.996cm^2 area and 1.1cm^3 volume. The wound is limited to skin breakdown. There is no tunneling or undermining noted. There is a large amount of serous drainage noted. The wound margin is distinct with the outline attached to the wound base. There is large (67-100%) pink granulation within the wound bed. There is a small (1-33%) amount of necrotic tissue within the wound bed including Adherent Slough. The periwound skin appearance exhibited: Excoriation, Induration, Localized Edema, Dry/Scaly, Ecchymosis. The periwound skin appearance did not exhibit: Callus, Crepitus, Fluctuance, Friable, Rash, Maceration, Moist. Periwound temperature was noted as No Abnormality. The periwound has tenderness on palpation. Assessment Active Problems ICD-10 E11.622 - Type 2 diabetes mellitus with other skin ulcer I89.0 - Lymphedema, not elsewhere classified E66.01 - Morbid (severe) obesity due to excess calories L97.222 -  Non-pressure chronic ulcer of left calf with fat layer exposed L97.212 - Non-pressure chronic ulcer of right calf with fat layer exposed Plan Wound Cleansing: Wound #1 Right,Anterior Lower Leg: Clean wound with Normal Saline. Cleanse wound with mild soap and water May Shower, gently pat wound dry prior to applying new dressing. Fernando MannanKUEIDER, Akshath ([id number]) Wound #2 Left,Lateral Lower Leg: Clean wound with Normal Saline. Cleanse wound with mild soap and water May Shower, gently pat wound dry prior to applying new dressing. Anesthetic: Wound #1 Right,Anterior Lower Leg: Topical Lidocaine 4% cream applied to wound bed prior to debridement - for clinic use Wound #2 Left,Lateral Lower Leg: Topical Lidocaine 4% cream applied to wound bed prior to debridement - for clinic use Primary Wound Dressing: Wound #1 Right,Anterior Lower  Leg: Aquacel Ag Wound #2 Left,Lateral Lower Leg: Aquacel Ag Secondary Dressing: Wound #1 Right,Anterior Lower Leg: ABD pad Conform/Kerlix Wound #2 Left,Lateral Lower Leg: ABD pad Kerlix and Coban Dressing Change Frequency: Wound #1 Right,Anterior Lower Leg: Change dressing every other day. Wound #2 Left,Lateral Lower Leg: Change dressing every other day. Follow-up Appointments: Wound #1 Right,Anterior Lower Leg: Return Appointment in 1 week. Wound #2 Left,Lateral Lower Leg: Return Appointment in 1 week. Edema Control: Wound #1 Right,Anterior Lower Leg: Elevate legs to the level of the heart and pump ankles as often as possible Other: - Wear your Juxtalites Wound #2 Left,Lateral Lower Leg: Elevate legs to the level of the heart and pump ankles as often as possible Other: - Wear your Juxtalites Additional Orders / Instructions: Wound #1 Right,Anterior Lower Leg: Stop Smoking Increase protein intake. Wound #2 Left,Lateral Lower Leg: Stop Smoking Increase protein intake. Medications-please add to medication list.: Wound #1 Right,Anterior Lower Leg: Other: - Vitamin A, Vitamin C, Zinc Wound #2 Left,Lateral Lower Leg: Other: - Vitamin A, Vitamin C, Zinc Antonio, Ahijah ([id number]) I have recommended: 1. silver alginate and his Juxtalite compression wraps which she has been using regularly 2. Elevation and exercise 3. completly giving up smoking and I have spent time discussing this again. He is vehemently opposed to giving up smoking. 4. arterial and venous duplex studies have already been done and I have reviewed his report and also the report from the vascular office. 5. Control of his diabetes mellitus and CHF 6. regular visits to the wound center Electronic Signature(s) Signed: 12/18/2015 11:51:40 AM By: Evlyn KannerBritto, Allysha Tryon MD, FACS Entered By: Evlyn KannerBritto, Kache Mcclurg on 12/18/2015 11:51:39 Fernando MannanKUEIDER, Alfred  ([id number]) -------------------------------------------------------------------------------- SuperBill Details Patient Name: Fernando MannanKUEIDER, Arsen Date of Service: 12/18/2015 Medical Record Number: [id number] Patient Account Number: [id number] Date of Birth/Sex: [birth year] [age] y.o. Male) Treating RN: Phillis HaggisPinkerton, Debi Primary Care Physician: Rolm GalaGrandis, Heidi Other Clinician: Referring Physician: Rolm GalaGrandis, Heidi Treating Physician/Extender: Rudene ReBritto, Cylee Dattilo Weeks in Treatment: 6 Diagnosis Coding ICD-10 Codes Code Description E11.622 Type 2 diabetes mellitus with other skin ulcer I89.0 Lymphedema, not elsewhere classified E66.01 Morbid (severe) obesity due to excess calories L97.222 Non-pressure chronic ulcer of left calf with fat layer exposed L97.212 Non-pressure chronic ulcer of right calf with fat layer exposed Facility Procedures CPT4 Code: [id number] Description: 99214 - WOUND CARE VISIT-LEV 4 EST PT Modifier: Quantity: 1 Physician Procedures CPT4 Code: [id number] Description: 99213 - WC PHYS LEVEL 3 - EST PT ICD-10 Description Diagnosis E11.622 Type 2 diabetes mellitus with other skin ulcer I89.0 Lymphedema, not elsewhere classified E66.01 Morbid (severe) obesity due to excess calories L97.222 Non-pressure  chronic ulcer of left calf with fat Modifier: layer exposed Quantity: 1 Electronic Signature(s) Signed: 12/18/2015 3:52:57 PM By: Evlyn KannerBritto, Weston Fulco MD,  FACS Signed: 12/18/2015 4:21:45 PM By: Alejandro Mulling Previous Signature: 12/18/2015 11:51:52 AM Version By: Evlyn Kanner MD, FACS Entered By: Alejandro Mulling on 12/18/2015 12:03:11

## 2015-12-19 NOTE — Progress Notes (Signed)
NINA, Fernando [name] ([id number]) Visit Report for 12/18/2015 Arrival Information Details Patient Name: Fernando [name], Fernando [name] Date of Service: 12/18/2015 9:45 AM Medical Record Number: [id number] Patient Account Number: [id number] Date of Birth/Sex: 1955/10/10 (60 y.o. Male) Treating RN: Phillis Haggis Primary Care Physician: Rolm Gala Other Clinician: Referring Physician: Rolm Gala Treating Physician/Extender: Rudene Re in Treatment: 6 Visit Information History Since Last Visit All ordered tests and consults were completed: No Patient Arrived: Ambulatory Added or deleted any medications: No Arrival Time: 10:05 Any new allergies or adverse reactions: No Accompanied By: wife Had a fall or experienced change in No Transfer Assistance: None activities of daily living that may affect Patient Identification Verified: Yes risk of falls: Secondary Verification Process Yes Signs or symptoms of abuse/neglect since last No Completed: visito Patient Requires Transmission- No Hospitalized since last visit: No Based Precautions: Has Dressing in Place as Prescribed: Yes Patient Has Alerts: Yes Has Compression in Place as Prescribed: Yes Patient Alerts: DMII Pain Present Now: No ABI [place] RIGHT >220 Electronic Signature(s) Signed: 12/18/2015 4:21:45 PM By: Alejandro Mulling Entered By: Alejandro Mulling on 12/18/2015 10:06:08 Fernando [name] ([id number]) -------------------------------------------------------------------------------- Clinic Level of Care Assessment Details Patient Name: Fernando [name] Date of Service: 12/18/2015 9:45 AM Medical Record Number: [id number] Patient Account Number: [id number] Date of Birth/Sex: [birth year] (60 y.o. Male) Treating RN: Phillis Haggis Primary Care Physician: Rolm Gala Other Clinician: Referring Physician: Rolm Gala Treating Physician/Extender: Rudene Re in Treatment: 6 Clinic Level of Care Assessment  Items TOOL 4 Quantity Score X - Use when only an EandM is performed on FOLLOW-UP visit 1 0 ASSESSMENTS - Nursing Assessment / Reassessment X - Reassessment of Co-morbidities (includes updates in patient status) 1 10 X - Reassessment of Adherence to Treatment Plan 1 5 ASSESSMENTS - Wound and Skin Assessment / Reassessment []  - Simple Wound Assessment / Reassessment - one wound 0 X - Complex Wound Assessment / Reassessment - multiple wounds 2 5 []  - Dermatologic / Skin Assessment (not related to wound area) 0 ASSESSMENTS - Focused Assessment X - Circumferential Edema Measurements - multi extremities 2 5 []  - Nutritional Assessment / Counseling / Intervention 0 []  - Lower Extremity Assessment (monofilament, tuning fork, pulses) 0 []  - Peripheral Arterial Disease Assessment (using hand held doppler) 0 ASSESSMENTS - Ostomy and/or Continence Assessment and Care []  - Incontinence Assessment and Management 0 []  - Ostomy Care Assessment and Management (repouching, etc.) 0 PROCESS - Coordination of Care []  - Simple Patient / Family Education for ongoing care 0 X - Complex (extensive) Patient / Family Education for ongoing care 1 20 X - Staff obtains Chiropractor, Records, Test Results / Process Orders 1 10 []  - Staff telephones HHA, Nursing Homes / Clarify orders / etc 0 []  - Routine Transfer to another Facility (non-emergent condition) 0 ARJAN, STROHM ([id number]) []  - Routine Hospital Admission (non-emergent condition) 0 []  - New Admissions / Manufacturing engineer / Ordering NPWT, Apligraf, etc. 0 []  - Emergency Hospital Admission (emergent condition) 0 X - Simple Discharge Coordination 1 10 []  - Complex (extensive) Discharge Coordination 0 PROCESS - Special Needs []  - Pediatric / Minor Patient Management 0 []  - Isolation Patient Management 0 []  - Hearing / Language / Visual special needs 0 []  - Assessment of Community assistance (transportation, D/C planning, etc.) 0 []  - Additional  assistance / Altered mentation 0 []  - Support Surface(s) Assessment (bed, cushion, seat, etc.) 0 INTERVENTIONS - Wound Cleansing / Measurement X - Simple Wound Cleansing - one wound 1 5 []  - Complex  Wound Cleansing - multiple wounds 0 X - Wound Imaging (photographs - any number of wounds) 1 5 []  - Wound Tracing (instead of photographs) 0 X - Simple Wound Measurement - one wound 1 5 []  - Complex Wound Measurement - multiple wounds 0 INTERVENTIONS - Wound Dressings []  - Small Wound Dressing one or multiple wounds 0 X - Medium Wound Dressing one or multiple wounds 2 15 []  - Large Wound Dressing one or multiple wounds 0 X - Application of Medications - topical 1 5 []  - Application of Medications - injection 0 INTERVENTIONS - Miscellaneous []  - External ear exam 0 Fernando MannanKUEIDER, Jaydenn ([id number]) []  - Specimen Collection (cultures, biopsies, blood, body fluids, etc.) 0 []  - Specimen(s) / Culture(s) sent or taken to Lab for analysis 0 []  - Patient Transfer (multiple staff / Michiel SitesHoyer Lift / Similar devices) 0 []  - Simple Staple / Suture removal (25 or less) 0 []  - Complex Staple / Suture removal (26 or more) 0 []  - Hypo / Hyperglycemic Management (close monitor of Blood Glucose) 0 []  - Ankle / Brachial Index (ABI) - do not check if billed separately 0 X - Vital Signs 1 5 Has the patient been seen at the hospital within the last three years: Yes Total Score: 130 Level Of Care: New/Established - Level 4 Electronic Signature(s) Signed: 12/18/2015 4:21:45 PM By: Alejandro MullingPinkerton, Debra Entered By: Alejandro MullingPinkerton, Debra on 12/18/2015 12:03:04 Fernando MannanKUEIDER, Denzil ([id number]) -------------------------------------------------------------------------------- Encounter Discharge Information Details Patient Name: Fernando MannanKUEIDER, Beth Date of Service: 12/18/2015 9:45 AM Medical Record Number: [id number] Patient Account Number: [id number] Date of Birth/Sex: 1955/07/29 [age] y.o. Male) Treating RN: Phillis HaggisPinkerton, Debi Primary Care  Physician: Rolm GalaGrandis, Heidi Other Clinician: Referring Physician: Rolm GalaGrandis, Heidi Treating Physician/Extender: Rudene ReBritto, Errol Weeks in Treatment: 6 Encounter Discharge Information Items Discharge Pain Level: 0 Discharge Condition: Stable Ambulatory Status: Ambulatory Discharge Destination: Home Transportation: Private Auto Accompanied By: wife Schedule Follow-up Appointment: Yes Medication Reconciliation completed and provided to Patient/Care Yes Rochell Puett: Provided on Clinical Summary of Care: 12/18/2015 Form Type Recipient Paper Patient SK Electronic Signature(s) Signed: 12/18/2015 10:49:51 AM By: Gwenlyn PerkingMoore, Shelia Entered By: Gwenlyn PerkingMoore, Shelia on 12/18/2015 10:49:51 Fernando MannanKUEIDER, Herndon ([id number]) -------------------------------------------------------------------------------- Lower Extremity Assessment Details Patient Name: Fernando MannanKUEIDER, Ziare Date of Service: 12/18/2015 9:45 AM Medical Record Number: [id number] Patient Account Number: [id number] Date of Birth/Sex: 1955/07/29 98(60 y.o. Male) Treating RN: Phillis HaggisPinkerton, Debi Primary Care Physician: Rolm GalaGrandis, Heidi Other Clinician: Referring Physician: Rolm GalaGrandis, Heidi Treating Physician/Extender: Rudene ReBritto, Errol Weeks in Treatment: 6 Edema Assessment Assessed: [Left: No] [Right: No] E[Left: dema] [Right: :] Calf Left: Right: Point of Measurement: 35 cm From Medial Instep 48 cm 48.5 cm Ankle Left: Right: Point of Measurement: 10 cm From Medial Instep 25.5 cm 25 cm Vascular Assessment Pulses: Posterior Tibial Dorsalis Pedis Palpable: [Left:Yes] [Right:Yes] Extremity colors, hair growth, and conditions: Extremity Color: [Left:Hyperpigmented] [Right:Hyperpigmented] Temperature of Extremity: [Left:Warm] [Right:Warm] Capillary Refill: [Left:< 3 seconds] [Right:< 3 seconds] Toe Nail Assessment Left: Right: Thick: No No Discolored: No No Deformed: No No Improper Length and Hygiene: Yes Yes Electronic Signature(s) Signed: 12/18/2015 4:21:45  PM By: Alejandro MullingPinkerton, Debra Entered By: Alejandro MullingPinkerton, Debra on 12/18/2015 10:16:52 Fernando MannanKUEIDER, Tadd ([id number]) -------------------------------------------------------------------------------- Multi Wound Chart Details Patient Name: Fernando MannanKUEIDER, Hyatt Date of Service: 12/18/2015 9:45 AM Medical Record Number: [id number] Patient Account Number: [id number] Date of Birth/Sex: 1955/07/29 [age] y.o. Male) Treating RN: Phillis HaggisPinkerton, Debi Primary Care Physician: Rolm GalaGrandis, Heidi Other Clinician: Referring Physician: Rolm GalaGrandis, Heidi Treating Physician/Extender: Rudene ReBritto, Errol Weeks in Treatment: 6 Vital Signs Height(in): 64 Pulse(bpm): 61 Weight(lbs): 291 Blood Pressure 131/60 (mmHg): Body Mass  Index(BMI): 50 Temperature(F): 97.6 Respiratory Rate 22 (breaths/min): Photos: [1:No Photos] [2:No Photos] [N/A:N/A] Wound Location: [1:Right Lower Leg - Anterior Left Lower Leg - Lateral] [N/A:N/A] Wounding Event: [1:Gradually Appeared] [2:Gradually Appeared] [N/A:N/A] Primary Etiology: [1:Lymphedema] [2:Lymphedema] [N/A:N/A] Secondary Etiology: [1:Venous Leg Ulcer] [2:Venous Leg Ulcer] [N/A:N/A] Comorbid History: [1:Lymphedema, Chronic Obstructive Pulmonary Disease (COPD), Sleep Disease (COPD), Sleep Apnea, Congestive Heart Apnea, Congestive Heart Failure, Hypertension, Peripheral Arterial Disease, Type II Diabetes Disease, Type II Diabetes]  [2:Lymphedema, Chronic Obstructive Pulmonary Failure, Hypertension, Peripheral Arterial] [N/A:N/A] Date Acquired: [1:07/03/2013] [2:07/03/2013] [N/A:N/A] Weeks of Treatment: [1:6] [2:6] [N/A:N/A] Wound Status: [1:Open] [2:Open] [N/A:N/A] Measurements L x W x D 7.7x9.5x0.2 [2:2x7x0.1] [N/A:N/A] (cm) Area (cm) : [1:57.452] [2:10.996] [N/A:N/A] Volume (cm) : [1:11.49] [2:1.1] [N/A:N/A] % Reduction in Area: [1:-59.60%] [2:56.90%] [N/A:N/A] % Reduction in Volume: -59.60% [2:56.90%] [N/A:N/A] Classification: [1:Full Thickness Without Exposed Support Structures] [2:Partial  Thickness] [N/A:N/A] HBO Classification: [1:Grade 1] [2:Grade 1] [N/A:N/A] Exudate Amount: [1:Large] [2:Large] [N/A:N/A] Exudate Type: [1:Serous] [2:Serous] [N/A:N/A] Exudate Color: [1:amber] [2:amber] [N/A:N/A] Wound Margin: [1:Distinct, outline attached Distinct, outline attached] [N/A:N/A] Granulation Amount: [1:Medium (34-66%)] [2:Large (67-100%)] [N/A:N/A] Granulation Quality: Red Pink N/A Necrotic Amount: Medium (34-66%) Small (1-33%) N/A Exposed Structures: Fascia: No Fascia: No N/A Fat: No Fat: No Tendon: No Tendon: No Muscle: No Muscle: No Joint: No Joint: No Bone: No Bone: No Limited to Skin Limited to Skin Breakdown Breakdown Epithelialization: None Small (1-33%) N/A Periwound Skin Texture: Edema: Yes Edema: Yes N/A Induration: Yes Excoriation: Yes Scarring: Yes Induration: Yes Callus: No Crepitus: No Fluctuance: No Friable: No Rash: No Periwound Skin Moist: Yes Dry/Scaly: Yes N/A Moisture: Maceration: No Moist: No Periwound Skin Color: Erythema: Yes Ecchymosis: Yes N/A Erythema Location: Circumferential N/A N/A Temperature: No Abnormality No Abnormality N/A Tenderness on Yes Yes N/A Palpation: Wound Preparation: Ulcer Cleansing: Ulcer Cleansing: N/A Rinsed/Irrigated with Rinsed/Irrigated with Saline Saline Topical Anesthetic Topical Anesthetic Applied: Other: lidocaine Applied: Other: lidocaine 4% 4% Treatment Notes Electronic Signature(s) Signed: 12/18/2015 4:21:45 PM By: Alejandro Mulling Entered By: Alejandro Mulling on 12/18/2015 10:17:10 Fernando [name] ([id number]) -------------------------------------------------------------------------------- Multi-Disciplinary Care Plan Details Patient Name: Fernando [name] Date of Service: 12/18/2015 9:45 AM Medical Record Number: [id number] Patient Account Number: [id number] Date of Birth/Sex: 09/20/1955 (60 y.o. Male) Treating RN: Phillis Haggis Primary Care Physician: Rolm Gala Other  Clinician: Referring Physician: Rolm Gala Treating Physician/Extender: Rudene Re in Treatment: 6 Active Inactive Abuse / Safety / Falls / Self Care Management Nursing Diagnoses: Impaired physical mobility Potential for falls Goals: Patient will remain injury free Date Initiated: 11/02/2015 Goal Status: Active Interventions: Assess fall risk on admission and as needed Notes: Orientation to the Wound Care Program Nursing Diagnoses: Knowledge deficit related to the wound healing center program Goals: Patient/caregiver will verbalize understanding of the Wound Healing Center Program Date Initiated: 11/02/2015 Goal Status: Active Interventions: Provide education on orientation to the wound center Notes: Wound/Skin Impairment Nursing Diagnoses: Impaired tissue integrity Goals: Patient/caregiver will verbalize understanding of skin care regimen KHY, PITRE ([id number]) Date Initiated: 11/02/2015 Goal Status: Active Ulcer/skin breakdown will have a volume reduction of 30% by week 4 Date Initiated: 11/02/2015 Goal Status: Active Ulcer/skin breakdown will have a volume reduction of 50% by week 8 Date Initiated: 11/02/2015 Goal Status: Active Ulcer/skin breakdown will have a volume reduction of 80% by week 12 Date Initiated: 11/02/2015 Goal Status: Active Ulcer/skin breakdown will heal within 14 weeks Date Initiated: 11/02/2015 Goal Status: Active Interventions: Assess patient/caregiver ability to obtain necessary supplies Assess patient/caregiver ability to perform ulcer/skin care regimen upon admission and  as needed Assess ulceration(s) every visit Notes: Electronic Signature(s) Signed: 12/18/2015 4:21:45 PM By: Alejandro Mulling Entered By: Alejandro Mulling on 12/18/2015 10:17:05 Fernando [name] ([id number]) -------------------------------------------------------------------------------- Pain Assessment Details Patient Name: Fernando [name] Date of  Service: 12/18/2015 9:45 AM Medical Record Number: [id number] Patient Account Number: [id number] Date of Birth/Sex: 06-13-1955 (60 y.o. Male) Treating RN: Phillis Haggis Primary Care Physician: Rolm Gala Other Clinician: Referring Physician: Rolm Gala Treating Physician/Extender: Rudene Re in Treatment: 6 Active Problems Location of Pain Severity and Description of Pain Patient Has Paino No Site Locations With Dressing Change: No Pain Management and Medication Current Pain Management: Electronic Signature(s) Signed: 12/18/2015 4:21:45 PM By: Alejandro Mulling Entered By: Alejandro Mulling on 12/18/2015 10:06:32 Fernando [name] ([id number]) -------------------------------------------------------------------------------- Patient/Caregiver Education Details Patient Name: Fernando [name] Date of Service: 12/18/2015 9:45 AM Medical Record Number: [id number] Patient Account Number: [id number] Date of Birth/Gender: 11-Oct-1955 (60 y.o. Male) Treating RN: Phillis Haggis Primary Care Physician: Rolm Gala Other Clinician: Referring Physician: Rolm Gala Treating Physician/Extender: Rudene Re in Treatment: 6 Education Assessment Education Provided To: Patient Education Topics Provided Wound/Skin Impairment: Handouts: Other: change dressing as ordered Methods: Demonstration, Explain/Verbal Responses: State content correctly Electronic Signature(s) Signed: 12/18/2015 4:21:45 PM By: Alejandro Mulling Entered By: Alejandro Mulling on 12/18/2015 10:27:55 Fernando [name] ([id number]) -------------------------------------------------------------------------------- Wound Assessment Details Patient Name: Fernando [name] Date of Service: 12/18/2015 9:45 AM Medical Record Number: [id number] Patient Account Number: [id number] Date of Birth/Sex: August 24, 1955 (60 y.o. Male) Treating RN: Phillis Haggis Primary Care Physician: Rolm Gala Other  Clinician: Referring Physician: Rolm Gala Treating Physician/Extender: Rudene Re in Treatment: 6 Wound Status Wound Number: 1 Primary Lymphedema Etiology: Wound Location: Right Lower Leg - Anterior Secondary Venous Leg Ulcer Wounding Event: Gradually Appeared Etiology: Date Acquired: 07/03/2013 Wound Open Weeks Of Treatment: 6 Status: Clustered Wound: No Comorbid Lymphedema, Chronic Obstructive History: Pulmonary Disease (COPD), Sleep Apnea, Congestive Heart Failure, Hypertension, Peripheral Arterial Disease, Type II Diabetes Photos Photo Uploaded By: Alejandro Mulling on 12/18/2015 10:31:34 Wound Measurements Length: (cm) 7.7 Width: (cm) 9.5 Depth: (cm) 0.2 Area: (cm) 57.452 Volume: (cm) 11.49 % Reduction in Area: -59.6% % Reduction in Volume: -59.6% Epithelialization: None Tunneling: No Undermining: No Wound Description Full Thickness Without Classification: Exposed Support Structures Diabetic Severity Grade 1 (Wagner): Wound Margin: Distinct, outline attached Exudate Amount: Large Exudate Type: Serous RYLIE, KNIERIM ([id number]) Foul Odor After Cleansing: No Exudate Color: amber Wound Bed Granulation Amount: Medium (34-66%) Exposed Structure Granulation Quality: Red Fascia Exposed: No Necrotic Amount: Medium (34-66%) Fat Layer Exposed: No Necrotic Quality: Adherent Slough Tendon Exposed: No Muscle Exposed: No Joint Exposed: No Bone Exposed: No Limited to Skin Breakdown Periwound Skin Texture Texture Color No Abnormalities Noted: No No Abnormalities Noted: No Induration: Yes Erythema: Yes Localized Edema: Yes Erythema Location: Circumferential Scarring: Yes Temperature / Pain Moisture Temperature: No Abnormality No Abnormalities Noted: No Tenderness on Palpation: Yes Moist: Yes Wound Preparation Ulcer Cleansing: Rinsed/Irrigated with Saline Topical Anesthetic Applied: Other: lidocaine 4%, Treatment Notes Wound #1 (Right,  Anterior Lower Leg) 1. Cleansed with: Clean wound with Normal Saline 2. Anesthetic 2% Lidocaine injectible with epinephrine prior to debridement 4. Dressing Applied: Aquacel Ag 5. Secondary Dressing Applied ABD Pad Kerlix/Conform 7. Secured with Secretary/administrator) Signed: 12/18/2015 4:21:45 PM By: Alejandro Mulling Entered By: Alejandro Mulling on 12/18/2015 10:15:39 Fernando [name] ([id number]) -------------------------------------------------------------------------------- Wound Assessment Details Patient Name: Fernando [name] Date of Service: 12/18/2015 9:45 AM Medical Record Number: [id number] Patient Account Number: [id number] Date of Birth/Sex: [birth year] (60 y.o. Male) Treating RN: Ashok Cordia, Debi  Primary Care Physician: Rolm GalaGrandis, Heidi Other Clinician: Referring Physician: Rolm GalaGrandis, Heidi Treating Physician/Extender: Rudene ReBritto, Errol Weeks in Treatment: 6 Wound Status Wound Number: 2 Primary Lymphedema Etiology: Wound Location: Left Lower Leg - Lateral Secondary Venous Leg Ulcer Wounding Event: Gradually Appeared Etiology: Date Acquired: 07/03/2013 Wound Open Weeks Of Treatment: 6 Status: Clustered Wound: No Comorbid Lymphedema, Chronic Obstructive History: Pulmonary Disease (COPD), Sleep Apnea, Congestive Heart Failure, Hypertension, Peripheral Arterial Disease, Type II Diabetes Photos Photo Uploaded By: Alejandro MullingPinkerton, Debra on 12/18/2015 10:31:35 Wound Measurements Length: (cm) 2 Width: (cm) 7 Depth: (cm) 0.1 Area: (cm) 10.996 Volume: (cm) 1.1 % Reduction in Area: 56.9% % Reduction in Volume: 56.9% Epithelialization: Small (1-33%) Tunneling: No Undermining: No Wound Description Classification: Partial Thickness Diabetic Severity (Wagner): Grade 1 Wound Margin: Distinct, outline attach Exudate Amount: Large Exudate Type: Serous Exudate Color: amber Fernando MannanKUEIDER, Jah ([id number]) Foul Odor After Cleansing: No ed Wound Bed Granulation Amount:  Large (67-100%) Exposed Structure Granulation Quality: Pink Fascia Exposed: No Necrotic Amount: Small (1-33%) Fat Layer Exposed: No Necrotic Quality: Adherent Slough Tendon Exposed: No Muscle Exposed: No Joint Exposed: No Bone Exposed: No Limited to Skin Breakdown Periwound Skin Texture Texture Color No Abnormalities Noted: No No Abnormalities Noted: No Callus: No Ecchymosis: Yes Crepitus: No Temperature / Pain Excoriation: Yes Temperature: No Abnormality Fluctuance: No Tenderness on Palpation: Yes Friable: No Induration: Yes Localized Edema: Yes Rash: No Moisture No Abnormalities Noted: No Dry / Scaly: Yes Maceration: No Moist: No Wound Preparation Ulcer Cleansing: Rinsed/Irrigated with Saline Topical Anesthetic Applied: Other: lidocaine 4%, Treatment Notes Wound #2 (Left, Lateral Lower Leg) 1. Cleansed with: Clean wound with Normal Saline 2. Anesthetic 2% Lidocaine injectible with epinephrine prior to debridement 4. Dressing Applied: Aquacel Ag 5. Secondary Dressing Applied ABD Pad Kerlix/Conform 7. Secured with Secretary/administratorTape Electronic Signature(s) Signed: 12/18/2015 4:21:45 PM By: Dyann KiefPinkerton, Debra Dobesh, Jeannett SeniorSTEPHEN ([id number]) Entered By: Alejandro MullingPinkerton, Debra on 12/18/2015 10:14:26 Fernando MannanKUEIDER, Ceaser ([id number]) -------------------------------------------------------------------------------- Vitals Details Patient Name: Fernando MannanKUEIDER, Nochum Date of Service: 12/18/2015 9:45 AM Medical Record Number: [id number] Patient Account Number: [id number] Date of Birth/Sex: [birth year] [age] y.o. Male) Treating RN: Phillis HaggisPinkerton, Debi Primary Care Physician: Rolm GalaGrandis, Heidi Other Clinician: Referring Physician: Rolm GalaGrandis, Heidi Treating Physician/Extender: Rudene ReBritto, Errol Weeks in Treatment: 6 Vital Signs Time Taken: 10:07 Temperature (F): 97.6 Height (in): 64 Pulse (bpm): 61 Weight (lbs): 291 Respiratory Rate (breaths/min): 22 Body Mass Index (BMI): 49.9 Blood Pressure (mmHg):  131/60 Reference Range: 80 - 120 mg / dl Electronic Signature(s) Signed: 12/18/2015 4:21:45 PM By: Alejandro MullingPinkerton, Debra Entered By: Alejandro MullingPinkerton, Debra on 12/18/2015 10:11:42

## 2015-12-20 DIAGNOSIS — J449 Chronic obstructive pulmonary disease, unspecified: Secondary | ICD-10-CM | POA: Diagnosis not present

## 2015-12-24 ENCOUNTER — Ambulatory Visit (INDEPENDENT_AMBULATORY_CARE_PROVIDER_SITE_OTHER): Payer: PPO | Admitting: Vascular Surgery

## 2015-12-24 ENCOUNTER — Ambulatory Visit: Payer: PPO | Admitting: Nurse Practitioner

## 2015-12-24 ENCOUNTER — Encounter (INDEPENDENT_AMBULATORY_CARE_PROVIDER_SITE_OTHER): Payer: Self-pay | Admitting: Vascular Surgery

## 2015-12-24 VITALS — BP 147/80 | HR 81 | Resp 17 | Wt 290.0 lb

## 2015-12-24 DIAGNOSIS — I2510 Atherosclerotic heart disease of native coronary artery without angina pectoris: Secondary | ICD-10-CM | POA: Diagnosis not present

## 2015-12-24 DIAGNOSIS — I509 Heart failure, unspecified: Secondary | ICD-10-CM | POA: Diagnosis not present

## 2015-12-24 DIAGNOSIS — I872 Venous insufficiency (chronic) (peripheral): Secondary | ICD-10-CM | POA: Diagnosis not present

## 2015-12-24 DIAGNOSIS — I890 Lymphedema, not elsewhere classified: Secondary | ICD-10-CM | POA: Diagnosis not present

## 2015-12-24 DIAGNOSIS — I10 Essential (primary) hypertension: Secondary | ICD-10-CM

## 2015-12-24 NOTE — Progress Notes (Signed)
MRN : [id number]  Fernando [name] is a 60 y.o. (1955/12/30) male who presents with chief complaint of No chief complaint on file. Marland Kitchen.  History of Present Illness:The patient returns to the office for followup evaluation regarding leg swelling, both he and his wife have questions about his recent noninvasive studies.  He is also followed by the wound care center and they're under the impression that Dr. Meyer RusselBritto felt that some surgery or intervention was neededThe swelling has persisted and the pain associated with swelling continues. There have not been any interval development of any new ulcerations or wounds.  The patient's wife does note the left ulcer is nearly healed and the right one seems to be improving steadily.  Since the previous visit the patient has been wearing graduated compression wraps and has noted little if any improvement in the lymphedema. The patient has been using compression routinely morning until night.  The patient also states elevation during the day and exercise is being done as best he can.  I have reviewed both the ABIs as well as the venous ultrasound with the patient and his wife. The results of these tests are as follows:  The venous ultrasound bilateral lower extremities demonstrates patent venous system both right and left legs. There is no evidence for DVT or scarring from old DVTs. The superficial venous system is evaluated and found to be competent no evidence of superficial venous reflux or superficial venous insufficiency.  Ankle brachial indices are greater than 1 both ankles with triphasic Doppler signals in the posterior tibial and dorsalis pedis distributions bilaterally and digital waveforms that show a near vertical rise with a dicrotic notch preserved. Essentially ABIs are normal bilaterally   No outpatient prescriptions have been marked as taking for the 12/24/15 encounter (Appointment) with Renford DillsGregory G Eleesha Purkey, MD.    Past Medical History:    Diagnosis Date  . CAD (coronary artery disease)   . CHF (congestive heart failure) (HCC)   . COPD (chronic obstructive pulmonary disease) (HCC)   . Diabetes mellitus without complication (HCC)   . Hyperlipidemia   . Hypertension   . Lymphedema   . Sleep apnea   . Venous insufficiency     Past Surgical History:  Procedure Laterality Date  . ADENOIDECTOMY    . ANGIOPLASTY    . APPENDECTOMY    . CARDIAC CATHETERIZATION    . HERNIA REPAIR    . TONSILLECTOMY      Social History Social History  Substance Use Topics  . Smoking status: Current Every Day Smoker    Packs/day: 2.00    Types: Cigarettes  . Smokeless tobacco: Current User  . Alcohol use No    Family History Family History  Problem Relation Age of Onset  . Cancer Father   no family history of bleeding clotting disorders porphyria or autoimmune disease  Allergies  Allergen Reactions  . Augmentin [Amoxicillin-Pot Clavulanate] Hives  . Codeine Hives  . Other Rash    methialate     REVIEW OF SYSTEMS (Negative unless checked)  Constitutional: [] Weight loss  [] Fever  [] Chills Cardiac: [] Chest pain   [] Chest pressure   [] Palpitations   [] Shortness of breath when laying flat   [x] Shortness of breath with exertion. Vascular:  [x] Pain in legs with walking   [x] Pain in legs at standing  [] History of DVT   [] Phlebitis   [x] Swelling in legs   [] Varicose veins   [x] slowly-healing ulcers Pulmonary:   [] Uses home oxygen   [] Productive cough   []   Hemoptysis   [] Wheeze  [] COPD   [] Asthma Neurologic:  [] Dizziness   [] Seizures   [] History of stroke   [] History of TIA  [] Aphasia   [] Vissual changes   [] Weakness or numbness in arm   [] Weakness or numbness in leg Musculoskeletal:   [] Joint swelling   [] Joint pain   [] Low back pain Hematologic:  [] Easy bruising  [] Easy bleeding   [] Hypercoagulable state   [] Anemic Gastrointestinal:  [] Diarrhea   [] Vomiting  [] Gastroesophageal reflux/heartburn   [] Difficulty  swallowing. Genitourinary:  [] Chronic kidney disease   [] Difficult urination  [] Frequent urination   [] Blood in urine Skin:  [] Rashes   [] Ulcers  Psychological:  [] History of anxiety   []  History of major depression.  Physical Examination  There were no vitals filed for this visit. There is no height or weight on file to calculate BMI. Gen: WD/WN, NAD Head: [place]/AT, No temporalis wasting.  Ear/Nose/Throat: Hearing grossly intact, nares w/o erythema or drainage, poor dentition Eyes: PER, EOMI, sclera nonicteric.  Neck: Supple, no masses.  No bruit or JVD.  Pulmonary:  Good air movement, clear to auscultation bilaterally, no use of accessory muscles.  Cardiac: RRR, normal S1, S2, no Murmurs. Vascular: marked venous stasis dermatitis noted bilaterally ulcers were dressed bilaterally severe 4+ woody edema bilaterally Vessel Right Left  Radial Palpable Palpable  Ulnar Palpable Palpable  Brachial Palpable Palpable  Carotid Palpable Palpable  Femoral Palpable Palpable  Popliteal Palpable Palpable  PT tracePalpable tracePalpable  DP tracePalpable tracePalpable   Gastrointestinal: soft, non-distended. No guarding/no peritoneal signs.  Musculoskeletal: M/S 5/5 throughout.  No deformity or atrophy.  Neurologic: CN 2-12 intact. Pain and light touch intact in extremities.  Symmetrical.  Speech is fluent. Motor exam as listed above. Psychiatric: Judgment intact, Mood & affect appropriate for pt's clinical situation. Dermatologic: No rashes or ulcers noted.  No changes consistent with cellulitis. Lymph : No Cervical lymphadenopathy, no lichenification or skin changes of chronic lymphedema.  CBC Lab Results  Component Value Date   WBC 9.0 07/09/2014   HGB 16.0 07/09/2014   HCT 47.6 07/09/2014   MCV 98.8 07/09/2014   PLT 155 07/09/2014    BMET    Component Value Date/Time   NA 140 07/08/2014 0434   K 4.2 07/08/2014 0434   CL 105 07/08/2014 0434   CO2 27 07/08/2014 0434   GLUCOSE 101  (H) 07/08/2014 0434   BUN 12 07/08/2014 0434   CREATININE 1.01 07/08/2014 0434   CALCIUM 8.8 (L) 07/08/2014 0434   GFRNONAA >60 07/08/2014 0434   GFRAA >60 07/08/2014 0434   CrCl cannot be calculated (Patient's most recent lab result is older than the maximum 21 days allowed.).  COAG No results found for: INR, PROTIME  Radiology No results found.   Assessment/Plan 1. Lymphedema Recommend:  No surgery or intervention at this point in time.    I have reviewed the discussion with the patient regarding swelling and why it causes symptoms.  Patient will continue wearing graduated compression wraps on a daily basis, a prescription was given to the patient for new wraps. The patient will  beginning wearing the stockings first thing in the morning and removing them in the evening. The patient is instructed specifically not to sleep in the stockings.    In addition, behavioral modification including several periods of elevation of the lower extremities during the day will be continued.  This was reviewed with the patient during the initial visit.  The patient will also continue routine exercise, especially walking  on a daily basis as was discussed during the initial visit.    Despite conservative treatments including graduated compression therapy class 1 and behavioral modification including exercise and elevation the patient  has not obtained adequate control of the lymphedema.  The patient still has stage 3 lymphedema and therefore, I believe that a lymph pump should be added to improve the control of the patient's lymphedema.  This process has already been initiated and we will stay in contact with [facility] to provide whatever documentation as needed  Additionally, a lymph pump is warranted because it will reduce the risk of cellulitis and ulceration in the future.  Patient should follow-up in six weeks   2. Chronic venous insufficiency This is a clinical diagnosis given the  appearance of a skin the ulcerations and his advanced swelling and edema. The treatment plan based on #1 is identical for this as well as the 2 coexist together  3. Essential hypertension Continue antihypertensive medications as already ordered and reviewed, no changes at this time.  4. Congestive heart failure, unspecified congestive heart failure chronicity, unspecified congestive heart failure type (HCC) Continue cardiac medications as already ordered and reviewed, no changes at this time.  5. Coronary artery disease involving native coronary artery of native heart without angina pectoris Continue cardiac andantihypertensive medications as already ordered and reviewed, no changes at this time.  Continue statin as ordered and reviewed, no changes at this time   Levora Dredge, MD  12/24/2015 11:54 AM

## 2016-01-20 DIAGNOSIS — J449 Chronic obstructive pulmonary disease, unspecified: Secondary | ICD-10-CM | POA: Diagnosis not present

## 2016-01-28 DIAGNOSIS — I890 Lymphedema, not elsewhere classified: Secondary | ICD-10-CM | POA: Diagnosis not present

## 2016-02-04 ENCOUNTER — Ambulatory Visit (INDEPENDENT_AMBULATORY_CARE_PROVIDER_SITE_OTHER): Payer: PPO | Admitting: Vascular Surgery

## 2016-02-15 ENCOUNTER — Ambulatory Visit (INDEPENDENT_AMBULATORY_CARE_PROVIDER_SITE_OTHER): Payer: PPO | Admitting: Vascular Surgery

## 2016-02-15 VITALS — BP 142/79 | HR 63 | Resp 19 | Ht 64.0 in | Wt 290.0 lb

## 2016-02-15 DIAGNOSIS — I890 Lymphedema, not elsewhere classified: Secondary | ICD-10-CM

## 2016-02-15 DIAGNOSIS — I2510 Atherosclerotic heart disease of native coronary artery without angina pectoris: Secondary | ICD-10-CM | POA: Diagnosis not present

## 2016-02-15 DIAGNOSIS — I10 Essential (primary) hypertension: Secondary | ICD-10-CM

## 2016-02-15 DIAGNOSIS — I872 Venous insufficiency (chronic) (peripheral): Secondary | ICD-10-CM | POA: Diagnosis not present

## 2016-02-15 NOTE — Progress Notes (Signed)
MRN : [id number]  Fernando [name] is a 61 y.o. (02-Nov-1955) male who presents with chief complaint of  Chief Complaint  Patient presents with  . Re-evaluation    6 week follow up no studies  .  History of Present Illness: The patient returns to the office for followup evaluation regarding leg swelling with ulceration, both he and his wife are frustrated that his wounds aren't healing and that his swelling isn't more improved.  He is also followed by the wound care center but have not followed up.  The swelling has persisted and the pain associated with swelling continues. There have not been any interval development of any new ulcerations or wounds.  The patient's wife does note the left ulcer is nearly healed and the right one seems to be improving steadily.  Since the previous visit the patient has been wearing graduated compression wraps and has noted little if any improvement in the lymphedema. The patient has been using compression routinely morning until night.  They also say he is using the lymph pump 3 times per day.  The patient also states elevation during the day and exercise is being done as best he can.   Previous Noninvasive Studies:  The venous ultrasound bilateral lower extremities demonstrates patent venous system both right and left legs. There is no evidence for DVT or scarring from old DVTs. The superficial venous system is evaluated and found to be competent no evidence of superficial venous reflux or superficial venous insufficiency.  Ankle brachial indices are greater than 1 both ankles with triphasic Doppler signals in the posterior tibial and dorsalis pedis distributions bilaterally and digital waveforms that show a near vertical rise with a dicrotic notch preserved. Essentially ABIs are normal bilaterally  Current Meds  Medication Sig  . acetaminophen (TYLENOL) 500 MG tablet Take 1,500 mg by mouth every 6 (six) hours as needed. For  Pain.  Marland Kitchen. aspirin EC 81 MG  tablet Take 81 mg by mouth daily.  Marland Kitchen. atorvastatin (LIPITOR) 40 MG tablet Take by mouth.  . carvedilol (COREG) 3.125 MG tablet Take 3.125 mg by mouth 2 (two) times daily.  . ciprofloxacin (CIPRO) 500 MG tablet Take 1 tablet (500 mg total) by mouth 2 (two) times daily.  . clindamycin (CLEOCIN) 300 MG capsule Take 1 capsule (300 mg total) by mouth 3 (three) times daily.  . cyanocobalamin (,VITAMIN B-12,) 1000 MCG/ML injection Inject 1,000 mcg into the muscle every 30 (thirty) days.  . cyclobenzaprine (FLEXERIL) 5 MG tablet   . DIGOX 125 MCG tablet   . EPINEPHrine (EPIPEN 2-PAK) 0.3 mg/0.3 mL IJ SOAJ injection Inject 0.3 mg as directed daily as needed. For allergic reaction.  . gabapentin (NEURONTIN) 300 MG capsule as needed.  Marland Kitchen. losartan (COZAAR) 50 MG tablet Take 50 mg by mouth daily.  . meloxicam (MOBIC) 15 MG tablet Take 15 mg by mouth daily as needed. With food for pain.  . metFORMIN (GLUCOPHAGE) 500 MG tablet Take by mouth.  . SPIRIVA HANDIHALER 18 MCG inhalation capsule   . triamcinolone cream (KENALOG) 0.1 % Apply 1 application topically daily as needed (for leg rash.).     Past Medical History:  Diagnosis Date  . CAD (coronary artery disease)   . CHF (congestive heart failure) (HCC)   . COPD (chronic obstructive pulmonary disease) (HCC)   . Diabetes mellitus without complication (HCC)   . Hyperlipidemia   . Hypertension   . Lymphedema   . Sleep apnea   . Venous insufficiency  Past Surgical History:  Procedure Laterality Date  . ADENOIDECTOMY    . ANGIOPLASTY    . APPENDECTOMY    . CARDIAC CATHETERIZATION    . HERNIA REPAIR    . TONSILLECTOMY      Social History Social History  Substance Use Topics  . Smoking status: Current Every Day Smoker    Packs/day: 2.00    Types: Cigarettes  . Smokeless tobacco: Current User  . Alcohol use No    Family History Family History  Problem Relation Age of Onset  . Cancer Father   No family history of bleeding/clotting  disorders, porphyria or autoimmune disease   Allergies  Allergen Reactions  . Augmentin [Amoxicillin-Pot Clavulanate] Hives  . Codeine Hives  . Other Rash    methialate     REVIEW OF SYSTEMS (Negative unless checked)  Constitutional: [] Weight loss  [] Fever  [] Chills Cardiac: [] Chest pain   [] Chest pressure   [] Palpitations   [x] Shortness of breath when laying flat   [x] Shortness of breath with exertion. Vascular:  [] Pain in legs with walking   [x] Pain in legs with standing  [] History of DVT   [] Phlebitis   [x] Swelling in legs   [] Varicose veins   [x]  Slowly healing ulcers Pulmonary:   [x] Uses home oxygen   [] Productive cough   [] Hemoptysis   [] Wheeze  [x] COPD   [x] Asthma Neurologic:  [] Dizziness   [] Seizures   [] History of stroke   [] History of TIA  [] Aphasia   [] Vissual changes   [] Weakness or numbness in arm   [] Weakness or numbness in leg Musculoskeletal:   [] Joint swelling   [] Joint pain   [] Low back pain Hematologic:  [] Easy bruising  [] Easy bleeding   [] Hypercoagulable state   [] Anemic Gastrointestinal:  [] Diarrhea   [] Vomiting  [] Gastroesophageal reflux/heartburn   [] Difficulty swallowing. Genitourinary:  [] Chronic kidney disease   [] Difficult urination  [] Frequent urination   [] Blood in urine Skin:  [] Rashes   [] Ulcers  Psychological:  [] History of anxiety   []  History of major depression.  Physical Examination  Vitals:   02/15/16 1036  BP: (!) 142/79  Pulse: 63  Resp: 19  Weight: 290 lb (131.5 kg)  Height: 5\' 4"  (1.626 m)   Body mass index is 49.78 kg/m. Gen: WD/WN, NAD Head: [place]/AT, No temporalis wasting.  Ear/Nose/Throat: Hearing grossly intact, nares w/o erythema or drainage, poor dentition Eyes: PER, EOMI, sclera nonicteric.  Neck: Supple, no masses.  No bruit or JVD.  Pulmonary:  Good air movement, clear to auscultation bilaterally, no use of accessory muscles.  Cardiac: RRR, normal S1, S2, no Murmurs. Vascular: marked venous stasis dermatitis noted  bilaterally ulcers were dressed bilaterally severe 4+ woody edema bilaterally Vessel Right Left  Radial Palpable Palpable  Ulnar Palpable Palpable  Brachial Palpable Palpable  Carotid Palpable Palpable  Femoral Palpable Palpable  Popliteal Palpable Palpable  PT tracePalpable tracePalpable  DP tracePalpable tracePalpable  Gastrointestinal: soft, non-distended. No guarding/no peritoneal signs.  Musculoskeletal: M/S 5/5 throughout.  No deformity or atrophy.  Neurologic: CN 2-12 intact. Pain and light touch intact in extremities.  Symmetrical.  Speech is fluent. Motor exam as listed above. Psychiatric: Judgment intact, Mood & affect appropriate for pt's clinical situation. Dermatologic: No rashes or ulcers noted.  No changes consistent with cellulitis. Lymph : No Cervical lymphadenopathy, no lichenification or skin changes of chronic lymphedema.  CBC Lab Results  Component Value Date   WBC 9.0 07/09/2014   HGB 16.0 07/09/2014   HCT 47.6 07/09/2014   MCV 98.8 07/09/2014  PLT 155 07/09/2014    BMET    Component Value Date/Time   NA 140 07/08/2014 0434   K 4.2 07/08/2014 0434   CL 105 07/08/2014 0434   CO2 27 07/08/2014 0434   GLUCOSE 101 (H) 07/08/2014 0434   BUN 12 07/08/2014 0434   CREATININE 1.01 07/08/2014 0434   CALCIUM 8.8 (L) 07/08/2014 0434   GFRNONAA >60 07/08/2014 0434   GFRAA >60 07/08/2014 0434   CrCl cannot be calculated (Patient's most recent lab result is older than the maximum 21 days allowed.).  COAG No results found for: INR, PROTIME  Radiology No results found.  Assessment/Plan 1. Lymphedema  No surgery or intervention at this point in time.    I have reviewed my discussion with the patient regarding lymphedema and why it  causes symptoms.  Patient will continue wearing graduated compression stockings class 1 (20-30 mmHg) on a daily basis a prescription was given. The patient is reminded to put the stockings on first thing in the morning and removing  them in the evening. The patient is instructed specifically not to sleep in the stockings.   In addition, behavioral modification throughout the day will be continued.  This will include frequent elevation (such as in a recliner), use of over the counter pain medications as needed and exercise such as walking.  I have reviewed systemic causes for chronic edema such as liver, kidney and cardiac etiologies and there does not appear to be any significant changes in these organ systems over the past year.  The patient is under the impression that these organ systems are all stable and unchanged.    The patient will continue aggressive use of the  lymph pump.  This will continue to improve the edema control and prevent sequela such as ulcers and infections.   The patient will follow-up with me on an annual basis.    2. Chronic venous insufficiency See #1  3. Coronary artery disease involving native coronary artery of native heart without angina pectoris Continue cardiac and antihypertensive medications as already ordered and reviewed, no changes at this time.  Continue statin as ordered and reviewed, no changes at this time  Nitrates PRN for chest pain   4. Essential hypertension Continue antihypertensive medications as already ordered, these medications have been reviewed and there are no changes at this time.    Levora Dredge, MD  02/15/2016 9:45 PM

## 2016-02-19 ENCOUNTER — Encounter: Payer: PPO | Attending: Surgery | Admitting: Surgery

## 2016-02-19 DIAGNOSIS — L97812 Non-pressure chronic ulcer of other part of right lower leg with fat layer exposed: Secondary | ICD-10-CM | POA: Diagnosis not present

## 2016-02-19 DIAGNOSIS — L97212 Non-pressure chronic ulcer of right calf with fat layer exposed: Secondary | ICD-10-CM | POA: Insufficient documentation

## 2016-02-19 DIAGNOSIS — J449 Chronic obstructive pulmonary disease, unspecified: Secondary | ICD-10-CM | POA: Insufficient documentation

## 2016-02-19 DIAGNOSIS — F17218 Nicotine dependence, cigarettes, with other nicotine-induced disorders: Secondary | ICD-10-CM | POA: Insufficient documentation

## 2016-02-19 DIAGNOSIS — Z6841 Body Mass Index (BMI) 40.0 and over, adult: Secondary | ICD-10-CM | POA: Insufficient documentation

## 2016-02-19 DIAGNOSIS — G4733 Obstructive sleep apnea (adult) (pediatric): Secondary | ICD-10-CM | POA: Insufficient documentation

## 2016-02-19 DIAGNOSIS — F17219 Nicotine dependence, cigarettes, with unspecified nicotine-induced disorders: Secondary | ICD-10-CM | POA: Diagnosis not present

## 2016-02-19 DIAGNOSIS — I110 Hypertensive heart disease with heart failure: Secondary | ICD-10-CM | POA: Diagnosis not present

## 2016-02-19 DIAGNOSIS — I890 Lymphedema, not elsewhere classified: Secondary | ICD-10-CM | POA: Insufficient documentation

## 2016-02-19 DIAGNOSIS — L97222 Non-pressure chronic ulcer of left calf with fat layer exposed: Secondary | ICD-10-CM | POA: Insufficient documentation

## 2016-02-19 DIAGNOSIS — E11622 Type 2 diabetes mellitus with other skin ulcer: Secondary | ICD-10-CM | POA: Insufficient documentation

## 2016-02-19 DIAGNOSIS — I2510 Atherosclerotic heart disease of native coronary artery without angina pectoris: Secondary | ICD-10-CM | POA: Diagnosis not present

## 2016-02-19 DIAGNOSIS — I509 Heart failure, unspecified: Secondary | ICD-10-CM | POA: Insufficient documentation

## 2016-02-19 DIAGNOSIS — I872 Venous insufficiency (chronic) (peripheral): Secondary | ICD-10-CM | POA: Diagnosis not present

## 2016-02-20 DIAGNOSIS — J449 Chronic obstructive pulmonary disease, unspecified: Secondary | ICD-10-CM | POA: Diagnosis not present

## 2016-02-20 NOTE — Progress Notes (Addendum)
Fernando [name], Fernando [name] ([id number]) Visit Report for 02/19/2016 Allergy List Details Patient Name: Fernando [name], Fernando [name] Date of Service: 02/19/2016 9:45 AM Medical Record Number: [id number] Patient Account Number: [id number] Date of Birth/Sex: 1955/04/03 52(60 y.o. Male) Treating RN: Phillis HaggisPinkerton, Debi Primary Care Dylin Breeden: Rolm GalaGrandis, Heidi Other Clinician: Referring Rahaf Carbonell: Rolm GalaGrandis, Heidi Treating Lilas Diefendorf/Extender: Rudene ReBritto, Errol Weeks in Treatment: 0 Allergies Active Allergies Augmentin codeine methialate Allergy Notes Electronic Signature(s) Signed: 02/19/2016 4:03:50 PM By: Alejandro MullingPinkerton, Debra Entered By: Alejandro MullingPinkerton, Debra on 02/19/2016 09:58:28 Fernando [name], Fernando [name] ([id number]) -------------------------------------------------------------------------------- Arrival Information Details Patient Name: Fernando [name], Fernando [name] Date of Service: 02/19/2016 9:45 AM Medical Record Number: [id number] Patient Account Number: [id number] Date of Birth/Sex: 1955/04/03 [age] y.o. Male) Treating RN: Ashok CordiaPinkerton, Debi Primary Care Jazsmine Macari: Rolm GalaGrandis, Heidi Other Clinician: Referring Carroll Lingelbach: Rolm GalaGrandis, Heidi Treating Saraih Lorton/Extender: Rudene ReBritto, Errol Weeks in Treatment: 0 Visit Information Patient Arrived: Ambulatory Arrival Time: 09:55 Accompanied By: wife Transfer Assistance: None Patient Identification Verified: Yes Secondary Verification Process Yes Completed: Patient Requires Transmission-Based No Precautions: Patient Has Alerts: No History Since Last Visit All ordered tests and consults were completed: No Added or deleted any medications: No Any new allergies or adverse reactions: No Had a fall or experienced change in activities of daily living that may affect risk of falls: No Signs or symptoms of abuse/neglect since last visito No Hospitalized since last visit: No Has Compression in Place as Prescribed: Yes Electronic Signature(s) Signed: 02/19/2016 4:03:50 PM By: Alejandro MullingPinkerton, Debra Entered By: Alejandro MullingPinkerton, Debra  on 02/19/2016 09:55:51 Fernando [name], Fernando [name] ([id number]) -------------------------------------------------------------------------------- Clinic Level of Care Assessment Details Patient Name: Fernando [name], Fernando [name] Date of Service: 02/19/2016 9:45 AM Medical Record Number: [id number] Patient Account Number: [id number] Date of Birth/Sex: 1955/04/03 61(60 y.o. Male) Treating RN: Phillis HaggisPinkerton, Debi Primary Care Jenkins Risdon: Rolm GalaGrandis, Heidi Other Clinician: Referring Sheanna Dail: Rolm GalaGrandis, Heidi Treating Apollo Timothy/Extender: Rudene ReBritto, Errol Weeks in Treatment: 0 Clinic Level of Care Assessment Items TOOL 1 Quantity Score X - Use when EandM and Procedure is performed on INITIAL visit 1 0 ASSESSMENTS - Nursing Assessment / Reassessment X - General Physical Exam (combine w/ comprehensive assessment (listed just 1 20 below) when performed on new pt. evals) X - Comprehensive Assessment (HX, ROS, Risk Assessments, Wounds Hx, etc.) 1 25 ASSESSMENTS - Wound and Skin Assessment / Reassessment []  - Dermatologic / Skin Assessment (not related to wound area) 0 ASSESSMENTS - Ostomy and/or Continence Assessment and Care []  - Incontinence Assessment and Management 0 []  - Ostomy Care Assessment and Management (repouching, etc.) 0 PROCESS - Coordination of Care []  - Simple Patient / Family Education for ongoing care 0 X - Complex (extensive) Patient / Family Education for ongoing care 1 20 X - Staff obtains ChiropractorConsents, Records, Test Results / Process Orders 1 10 X - Staff telephones HHA, Nursing Homes / Clarify orders / etc 1 10 []  - Routine Transfer to another Facility (non-emergent condition) 0 []  - Routine Hospital Admission (non-emergent condition) 0 X - New Admissions / Manufacturing engineernsurance Authorizations / Ordering NPWT, Apligraf, etc. 1 15 []  - Emergency Hospital Admission (emergent condition) 0 PROCESS - Special Needs []  - Pediatric / Minor Patient Management 0 []  - Isolation Patient Management 0 Fernando [name], Fernando [name] ([id number]) []  -  Hearing / Language / Visual special needs 0 []  - Assessment of Community assistance (transportation, D/C planning, etc.) 0 []  - Additional assistance / Altered mentation 0 []  - Support Surface(s) Assessment (bed, cushion, seat, etc.) 0 INTERVENTIONS - Miscellaneous []  - External ear exam 0 []  - Patient Transfer (multiple staff / Nurse, adultHoyer Lift / Similar devices) 0 []  - Simple Staple /  Suture removal (25 or less) 0 []  - Complex Staple / Suture removal (26 or more) 0 []  - Hypo/Hyperglycemic Management (do not check if billed separately) 0 X - Ankle / Brachial Index (ABI) - do not check if billed separately 1 15 Has the patient been seen at the hospital within the last three years: Yes Total Score: 115 Level Of Care: New/Established - Level 3 Electronic Signature(s) Signed: 02/19/2016 4:03:50 PM By: Alejandro Mulling Entered By: Alejandro Mulling on 02/19/2016 13:43:52 Fernando [name] ([id number]) -------------------------------------------------------------------------------- Encounter Discharge Information Details Patient Name: Fernando [name] Date of Service: 02/19/2016 9:45 AM Medical Record Number: [id number] Patient Account Number: [id number] Date of Birth/Sex: [birth year] (61 y.o. Male) Treating RN: Phillis Haggis Primary Care Litha Lamartina: Rolm Gala Other Clinician: Referring Lynnox Girten: Rolm Gala Treating Laprecious Austill/Extender: Rudene Re in Treatment: 0 Encounter Discharge Information Items Discharge Pain Level: 0 Discharge Condition: Stable Ambulatory Status: Ambulatory Discharge Destination: Home Transportation: Private Auto Accompanied By: wife Schedule Follow-up Appointment: Yes Medication Reconciliation completed and provided to Patient/Care No Ryheem Jay: Provided on Clinical Summary of Care: 02/19/2016 Form Type Recipient Paper Patient SK Electronic Signature(s) Signed: 02/19/2016 11:05:04 AM By: Gwenlyn Perking Entered By: Gwenlyn Perking on 02/19/2016  11:05:04 Fernando [name] ([id number]) -------------------------------------------------------------------------------- Lower Extremity Assessment Details Patient Name: Fernando [name] Date of Service: 02/19/2016 9:45 AM Medical Record Number: [id number] Patient Account Number: [id number] Date of Birth/Sex: 1955/04/15 (60 y.o. Male) Treating RN: Phillis Haggis Primary Care Rontae Inglett: Rolm Gala Other Clinician: Referring Adlai Nieblas: Rolm Gala Treating Shakeel Disney/Extender: Rudene Re in Treatment: 0 Edema Assessment Assessed: [Left: No] [Right: No] E[Left: dema] [Right: :] Calf Left: Right: Point of Measurement: 32 cm From Medial Instep 45.5 cm 51.4 cm Ankle Left: Right: Point of Measurement: 10 cm From Medial Instep 26.2 cm 26 cm Vascular Assessment Pulses: Posterior Tibial Extremity colors, hair growth, and conditions: Extremity Color: [Left:Hyperpigmented] [Right:Hyperpigmented] Hair Growth on Extremity: [Left:Yes] [Right:Yes] Temperature of Extremity: [Left:Warm] [Right:Warm] Capillary Refill: [Left:> 3 seconds] [Right:> 3 seconds] Blood Pressure: Brachial: [Left:114] Dorsalis Pedis: 168 [Left:Dorsalis Pedis:] Ankle: Posterior Tibial: 162 [Left:Posterior Tibial: 1.47] Toe Nail Assessment Left: Right: Thick: Yes Yes Discolored: Yes Yes Deformed: Yes Yes Improper Length and Hygiene: Yes Yes Notes R ABI non-compressible Electronic Signature(s) SHABAZZ, MCKEY ([id number]) Signed: 02/19/2016 4:03:50 PM By: Alejandro Mulling Entered By: Alejandro Mulling on 02/19/2016 10:17:22 Fernando [name] ([id number]) -------------------------------------------------------------------------------- Multi Wound Chart Details Patient Name: Fernando [name] Date of Service: 02/19/2016 9:45 AM Medical Record Number: [id number] Patient Account Number: [id number] Date of Birth/Sex: 27-May-1955 (61 y.o. 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] [4:Venous Leg Ulcer] [N/A:N/A] Comorbid History: [3:Lymphedema, Chronic Obstructive Pulmonary Disease (COPD), Sleep Apnea, Congestive Heart Failure, Hypertension, Peripheral Arterial Disease, Type II Diabetes] [4:Lymphedema, Chronic Obstructive Pulmonary Disease (COPD), Sleep Apnea,  Congestive Heart Failure, Hypertension, Peripheral Arterial Disease, Type II Diabetes] [N/A:N/A] Date Acquired: [3:02/05/2016] [4:02/05/2016] [N/A:N/A] Weeks of Treatment: [3:0] [4:0] [N/A:N/A] Wound Status: [3:Open] [4:Open] [N/A:N/A] Measurements L x W x D 3.6x5.2x0.1 [4:7.5x10x0.2] [N/A:N/A] (cm) Area (cm) : [3:14.703] [4:58.905] [N/A:N/A] Volume (cm) : [3:1.47] [4:11.781] [N/A:N/A] Classification: [3:Partial Thickness] [4:Partial Thickness] [N/A:N/A] HBO Classification: [3:Grade 1] [4:Grade 2] [  N/A:N/A] Exudate Amount: [3:Large] [4:Large] [N/A:N/A] Exudate Type: [3:Serous] [4:Serosanguineous] [N/A:N/A] Exudate Color: [3:amber] [4:red, brown] [N/A:N/A] Wound Margin: [3:Distinct, outline attached] [4:Distinct, outline attached] [N/A:N/A] Granulation Amount: [3:Large (67-100%)] [4:Small (1-33%)] [N/A:N/A] Granulation Quality: [3:Red, Pink] [4:Red, Pink] [N/A:N/A] Necrotic Amount: [3:Small (1-33%)] [4:Large (67-100%)] [N/A:N/A] Exposed Structures: [3:Fascia: No Fat Layer (Subcutaneous Tissue) Exposed: No] [4:Fat Layer (Subcutaneous Tissue) Exposed: Yes] [N/A:N/A] Tendon:  No Muscle: No Joint: No Bone: No Limited to Skin Breakdown Epithelialization: None None N/A Periwound Skin Texture: No Abnormalities Noted No Abnormalities Noted N/A Periwound Skin Maceration: Yes Maceration: Yes N/A Moisture: Periwound Skin Color: No Abnormalities Noted No Abnormalities Noted N/A Temperature: No Abnormality No Abnormality N/A Tenderness on No Yes N/A Palpation: Wound Preparation: Ulcer Cleansing: Ulcer Cleansing: N/A Rinsed/Irrigated with Rinsed/Irrigated with Saline Saline Topical Anesthetic Topical Anesthetic Applied: Other: lidocaine Applied: Other: lidocaine 4% 4% Treatment Notes Electronic Signature(s) Signed: 02/19/2016 4:03:50 PM By: Alejandro Mulling Previous Signature: 02/19/2016 10:24:24 AM Version By: Evlyn Kanner MD, FACS Entered By: Alejandro Mulling on 02/19/2016 10:30:49 Fernando [name] ([id number]) -------------------------------------------------------------------------------- Multi-Disciplinary Care Plan Details Patient Name: Fernando [name] Date of Service: 02/19/2016 9:45 AM Medical Record Number: [id number] Patient Account Number: [id number] Date of Birth/Sex: 12/09/1955 (61 y.o. Male) Treating RN: Phillis Haggis Primary Care Decorey Wahlert: Rolm Gala Other Clinician: Referring Jlyn Cerros: Rolm Gala Treating Arby Dahir/Extender: Rudene Re in Treatment: 0 Active Inactive Electronic Signature(s) Signed: 03/02/2016 9:15:25 AM By: Elliot Gurney RN, BSN, Kim RN, BSN Signed: 03/11/2016 5:07:46 PM By: Alejandro Mulling Previous Signature: 02/19/2016 4:03:50 PM Version By: Alejandro Mulling Entered By: Elliot Gurney RN, BSN, Kim on 03/02/2016 09:15:25 Fernando [name] ([id number]) -------------------------------------------------------------------------------- Pain Assessment Details Patient Name: Fernando [name] Date of Service: 02/19/2016 9:45 AM Medical Record Number: [id number] Patient Account Number: [id number] Date of Birth/Sex: Oct 27, 1955 (61  y.o. Male) Treating RN: Phillis Haggis Primary Care Treyvin Glidden: Rolm Gala Other Clinician: Referring Irby Fails: Rolm Gala Treating Kue Fox/Extender: Rudene Re in Treatment: 0 Active Problems Location of Pain Severity and Description of Pain Patient Has Paino No Site Locations With Dressing Change: No Pain Management and Medication Current Pain Management: Electronic Signature(s) Signed: 02/19/2016 4:03:50 PM By: Alejandro Mulling Entered By: Alejandro Mulling on 02/19/2016 09:55:57 Fernando [name] ([id number]) -------------------------------------------------------------------------------- Patient/Caregiver Education Details Patient Name: Fernando [name] Date of Service: 02/19/2016 9:45 AM Medical Record Number: [id number] Patient Account Number: [id number] Date of Birth/Gender: 19-May-1955 (61 y.o. Male) Treating RN: Phillis Haggis Primary Care Physician: Rolm Gala Other Clinician: Referring Physician: Rolm Gala Treating Physician/Extender: Rudene Re in Treatment: 0 Education Assessment Education Provided To: Patient Education Topics Provided [facility]: Handouts: [facility] Methods: Explain/Verbal Responses: State content correctly Wound/Skin Impairment: Handouts: Other: change dressing as ordered Methods: Demonstration, Explain/Verbal Responses: State content correctly Electronic Signature(s) Signed: 02/19/2016 4:03:50 PM By: Alejandro Mulling Entered By: Alejandro Mulling on 02/19/2016 10:31:36 Fernando [name] ([id number]) -------------------------------------------------------------------------------- Wound Assessment Details Patient Name: Fernando [name] Date of Service: 02/19/2016 9:45 AM Medical Record Number: [id number] Patient Account Number: [id number] Date of Birth/Sex: [birth year] (61 y.o. Male) Treating RN: Phillis Haggis Primary Care Tenise Stetler: Rolm Gala Other  Clinician: Referring Stana Bayon: Rolm Gala Treating Gertrue Willette/Extender: Rudene Re in Treatment: 0 Wound Status Wound Number: 3 Primary Venous Leg Ulcer Etiology: Wound Location: Left Lower Leg - Lateral Wound Open Wounding Event: Gradually Appeared Status: Date Acquired: 02/05/2016 Comorbid Lymphedema, Chronic Obstructive Weeks Of Treatment: 0 History: Pulmonary Disease (COPD), Sleep Clustered Wound: No Apnea, Congestive Heart Failure, Hypertension, Peripheral Arterial Disease, Type II Diabetes Photos Photo Uploaded By: Alejandro Mulling  on 02/19/2016 15:53:04 Wound Measurements Length: (cm) 3.6 Width: (cm) 5.2 Depth: (cm) 0.1 Area: (cm) 14.703 Volume: (cm) 1.47 % Reduction in Area: % Reduction in Volume: Epithelialization: None Tunneling: No Undermining: No Wound Description Classification: Partial Thickness Diabetic Severity (Wagner): Grade 1 Wound Margin: Distinct, outline attach Exudate Amount: Large Exudate Type: Serous Exudate Color: amber Foul Odor After Cleansing: No Slough/Fibrino No ed Wound Bed Granulation Amount: Large (67-100%) Exposed Structure KALIM, KISSEL ([id number]) Granulation Quality: Red, Pink Fascia Exposed: No Necrotic Amount: Small (1-33%) Fat Layer (Subcutaneous Tissue) Exposed: No Necrotic Quality: Adherent Slough Tendon Exposed: No Muscle Exposed: No Joint Exposed: No Bone Exposed: No Limited to Skin Breakdown Periwound Skin Texture Texture Color No Abnormalities Noted: No No Abnormalities Noted: No Moisture Temperature / Pain No Abnormalities Noted: No Temperature: No Abnormality Maceration: Yes Wound Preparation Ulcer Cleansing: Rinsed/Irrigated with Saline Topical Anesthetic Applied: Other: lidocaine 4%, Electronic Signature(s) Signed: 02/19/2016 4:03:50 PM By: Alejandro Mulling Entered By: Alejandro Mulling on 02/19/2016 10:20:05 Fernando [name]  ([id number]) -------------------------------------------------------------------------------- Wound Assessment Details Patient Name: Fernando [name] Date of Service: 02/19/2016 9:45 AM Medical Record Number: [id number] Patient Account Number: [id number] Date of Birth/Sex: [birth year] (61 y.o. Male) Treating RN: Phillis Haggis Primary Care Evanny Ellerbe: Rolm Gala Other Clinician: Referring Kamir Selover: Rolm Gala Treating Betty Brooks/Extender: Rudene Re in Treatment: 0 Wound Status Wound Number: 4 Primary Venous Leg Ulcer Etiology: Wound Location: Right Lower Leg - Anterior Wound Open Wounding Event: Gradually Appeared Status: Date Acquired: 02/05/2016 Comorbid Lymphedema, Chronic Obstructive Weeks Of Treatment: 0 History: Pulmonary Disease (COPD), Sleep Clustered Wound: No Apnea, Congestive Heart Failure, Hypertension, Peripheral Arterial Disease, Type II Diabetes Photos Photo Uploaded By: Alejandro Mulling on 02/19/2016 15:53:45 Wound Measurements Length: (cm) 7.5 Width: (cm) 10 Depth: (cm) 0.2 Area: (cm) 58.905 Volume: (cm) 11.781 % Reduction in Area: % Reduction in Volume: Epithelialization: None Tunneling: No Wound Description Classification: Partial Thickness Diabetic Severity [address]): Grade 2 Wound Margin: Distinct, outline attach Exudate Amount: Large Exudate Type: Serosanguineous Exudate Color: red, brown Foul Odor After Cleansing: No Slough/Fibrino No ed Wound Bed Granulation Amount: Small (1-33%) Exposed Structure BAYDEN, GIL ([id number]) Granulation Quality: Red, Pink Fat Layer (Subcutaneous Tissue) Exposed: Yes Necrotic Amount: Large (67-100%) Necrotic Quality: Adherent Slough Periwound Skin Texture Texture Color No Abnormalities Noted: No No Abnormalities Noted: No Moisture Temperature / Pain No Abnormalities Noted: No Temperature: No Abnormality Maceration: Yes Tenderness on Palpation: Yes Wound Preparation Ulcer Cleansing:  Rinsed/Irrigated with Saline Topical Anesthetic Applied: Other: lidocaine 4%, Electronic Signature(s) Signed: 02/19/2016 4:03:50 PM By: Alejandro Mulling Entered By: Alejandro Mulling on 02/19/2016 10:23:00 Fernando [name] ([id number]) -------------------------------------------------------------------------------- Vitals Details Patient Name: Fernando [name] Date of Service: 02/19/2016 9:45 AM Medical Record Number: [id number] Patient Account Number: [id number] Date of Birth/Sex: 1955/02/06 (61 y.o. Male) Treating RN: Phillis Haggis Primary Care Jasani Dolney: Rolm Gala Other Clinician: Referring Qadir Folks: Rolm Gala Treating Dedra Matsuo/Extender: Rudene Re in Treatment: 0 Vital Signs Time Taken: 09:56 Temperature (F): 97.6 Height (in): 65 Pulse (bpm): 67 Source: Stated Respiratory Rate (breaths/min): 22 Weight (lbs): 288.4 Blood Pressure (mmHg): 114/80 Source: Measured Reference Range: 80 - 120 mg / dl Body Mass Index (BMI): 48 Electronic Signature(s) Signed: 02/19/2016 4:03:50 PM By: Alejandro Mulling Entered By: Alejandro Mulling on 02/19/2016 09:57:52

## 2016-02-20 NOTE — Progress Notes (Addendum)
Fernando MannanKUEIDER, Eldar ([id number]) Visit Report for 02/19/2016 Chief Complaint Document Details Patient Name: Fernando MannanKUEIDER, Fernando [name] Date of Service: 02/19/2016 9:45 AM Medical Record Number: [id number] Patient Account Number: [id number] Date of Birth/Sex: [birth year] 40(60 y.o. Male) Treating RN: Phillis HaggisPinkerton, Debi Primary Care Provider: Rolm GalaGrandis, Heidi Other Clinician: Referring Provider: Rolm GalaGrandis, Heidi Treating Provider/Extender: Rudene ReBritto, Yuka Lallier Weeks in Treatment: 0 Information Obtained from: Patient Chief Complaint Patient presents to the wound care center for a consult due non healing wound Both lower extremities with swelling and has had this fo 4 years. He was seen earlier by as in November and December 2017 and was lost to follow-up via seeing him back after 2 months Electronic Signature(s) Signed: 02/19/2016 10:24:54 AM By: Evlyn KannerBritto, Deann Mclaine MD, FACS Entered By: Evlyn KannerBritto, Montzerrat Brunell on 02/19/2016 10:24:54 Fernando MannanKUEIDER, Fernando [name] ([id number]) -------------------------------------------------------------------------------- Debridement Details Patient Name: Fernando MannanKUEIDER, Fernando [name] Date of Service: 02/19/2016 9:45 AM Medical Record Number: [id number] Patient Account Number: [id number] Date of Birth/Sex: [birth year](60 y.o. Male) Treating RN: Phillis HaggisPinkerton, Debi Primary Care Provider: Rolm GalaGrandis, Heidi Other Clinician: Referring Provider: Rolm GalaGrandis, Heidi Treating Provider/Extender: Rudene ReBritto, Chariah Bailey Weeks in Treatment: 0 Debridement Performed for Wound #4 Right,Anterior Lower Leg Assessment: Performed By: Physician Evlyn KannerBritto, Vanity Larsson, MD Debridement: Debridement Pre-procedure Yes - 10:33 Verification/Time Out Taken: Start Time: 10:34 Pain Control: Lidocaine 4% Topical Solution Level: Skin/Subcutaneous Tissue Total Area Debrided (L x 7.5 (cm) x 10 (cm) = 75 (cm) W): Tissue and other Viable, Non-Viable, Exudate, Fibrin/Slough, Subcutaneous material debrided: Instrument: Curette Bleeding: Minimum Hemostasis Achieved: Pressure End  Time: 10:40 Procedural Pain: 0 Post Procedural Pain: 0 Response to Treatment: Procedure was tolerated well Post Debridement Measurements of Total Wound Length: (cm) 7.5 Width: (cm) 10 Depth: (cm) 0.3 Volume: (cm) 17.671 Character of Wound/Ulcer Post Debridement: Requires Further Debridement Severity of Tissue Post Debridement: Fat layer exposed Post Procedure Diagnosis Same as Pre-procedure Electronic Signature(s) Signed: 02/19/2016 10:52:17 AM By: Evlyn KannerBritto, Bellah Alia MD, FACS Signed: 02/19/2016 4:03:50 PM By: Alejandro MullingPinkerton, Debra Entered By: Evlyn KannerBritto, Khiree Bukhari on 02/19/2016 10:52:17 Fernando MannanKUEIDER, Fernando [name] ([id number]) -------------------------------------------------------------------------------- HPI Details Patient Name: Fernando MannanKUEIDER, Fernando [name] Date of Service: 02/19/2016 9:45 AM Medical Record Number: [id number] Patient Account Number: [id number] Date of Birth/Sex: [birth year] [age] y.o. Male) Treating RN: Phillis HaggisPinkerton, Debi Primary Care Provider: Rolm GalaGrandis, Heidi Other Clinician: Referring Provider: Rolm GalaGrandis, Heidi Treating Provider/Extender: Rudene ReBritto, Brightyn Mozer Weeks in Treatment: 0 History of Present Illness Location: ulcers both lower extremities right worse than left Quality: Patient reports experiencing a dull pain to affected area(s). Severity: Patient states wound are getting worse. Duration: Patient has had the wound for > 3 years prior to seeking treatment at the wound center Timing: Pain in wound is Intermittent (comes and goes Context: The wound appeared gradually over time Modifying Factors: [facility] to [facility] Associated Signs and Symptoms: Patient reports having increase swelling. HPI Description: 61 year old gentleman who has been very noncompliant with his treatment in the past, has not seen us for 2 months meanwhile has been set up with lymphedema pumps by the vein and vascular office of Dr. Gilda CreaseSchnier. He comes with a large ulceration in lower extremity,  which is been there and much worse compared to before. He has been getting dressing changes done by his wife and wants to return for treatment under us. he continues to smoke about 2 packets of cigarettes a day. ====================================================================================================== Old notes 61 year old gentleman who has diabetes mellitus, been referred to was for use with lymphedema and peripheral vascular disease. Sta had a vascular workup ordered and has a scale opinion done recently. He is a  smoker and smokes about 2 packs of cigarettes a day. past medical history significant for coronary artery disease, CHF, COPD, hyperlipidemia, hypertension, lower extremity edema, obesity addiction, status post chronically occluded right coronary artery, history of obstructive sleep apnea treated with a CPAP machine. he is status post hernia repair, tonsillectomy, appendectomy and angioplasty. Note is Made that he has been treated at the wound center for chronic lymphedema with ulceration which improves with Unna's but rec he does not have them. hemoglobin A1c was 6.6%. 11/09/2015 -- the patient has not had his vascular tests so far and from what the wife tells me his compression wraps have been slippin they need to be changed more frequently. He has several excuses for not stopping to smoke and we have address this again. 11/30/2015 -- the patient has lost home health coverage and hence his wife and he have to do the dressings. They do have some prev ordered compression wraps which they have been using in the past. 12/18/2015 -- patient has had recent venous duplex examination done which shows no DVT no SVT or no reflux The arterial study done showed a right ABI of 1.21 and the left of 1.3 which was consistent with noncompressible arteries due to media calcification. The left great toe pressure and PPG waveform are decreased and the right great toe pressure and PPG waveforms  are w normal limits. The right toe brachial index was 1.0 and the left was 0.68. He was seen by the PA at the vascular office-- Ms. Cleda Daub -- who recommended lymphedema pumps for the stage III lym cessation of smoking and as far as the peripheral arterial disease goes she recommended no intervention at this time as the ABIs were acceptable and to follow-up in a year with ABI. ====================================================================================================== Electronic Signature(s) Signed: 02/19/2016 10:50:04 AM By: Evlyn Kanner MD, FACS Previous Signature: 02/19/2016 10:26:10 AM Version By: Evlyn Kanner MD, FACS Entered By: Evlyn Kanner on 02/19/2016 10:50:04 Fernando [name] ([id number]) -------------------------------------------------------------------------------- Physical Exam Details Patient Name: Fernando [name] Date of Service: 02/19/2016 9:45 AM Medical Record Number: [id number] Patient Account Number: [id number] Date of Birth/Sex: [birth year] (61 y.o. Male) Treating RN: Phillis Haggis Primary Care Provider: Rolm Gala Other Clinician: Referring Provider: Rolm Gala Treating Provider/Extender: Rudene Re in Treatment: 0 Constitutional . Pulse regular. Respirations normal and unlabored. Afebrile. . Eyes Nonicteric. Reactive to light. Ears, Nose, Mouth, and Throat Lips, teeth, and gums WNL.Marland Kitchen Moist mucosa without lesions. Neck supple and nontender. No palpable supraclavicular or cervical adenopathy. Normal sized without goiter. Respiratory WNL. No retractions.. Breath sounds WNL, No rubs, rales, rhonchi, or wheeze.. Cardiovascular Pedal Pulses WNL. recent arterial duplex studies noted. lymphedema both lower extremities right worse than left. Chest Breasts symmetical and no nipple discharge.. Breast tissue WNL, no masses, lumps, or tenderness.. Gastrointestinal (GI) Abdomen without masses or tenderness.. No liver or spleen  enlargement or tenderness.. Lymphatic No adneopathy. No adenopathy. No adenopathy. Musculoskeletal Adexa without tenderness or enlargement.. Digits and nails w/o clubbing, cyanosis, infection, petechiae, ischemia, or inflammatory con Integumentary (Hair, Skin) No suspicious lesions. No crepitus or fluctuance. No peri-wound warmth or erythema. No masses.Marland Kitchen Psychiatric Judgement and insight Intact.. No evidence of depression, anxiety, or agitation.. Notes patient has stage II bilateral lymphedema with minimal ulceration and excoriation on the left lower extremity in the calf and lateral comp On the right he has a large ulceration with subcutaneous debris and excessive amount of slough. Debridement was done with a #3 cur bleeding controlled with pressure Electronic Signature(s) Signed: 02/19/2016  10:51:55 AM By: Evlyn Kanner MD, FACS Entered By: Evlyn Kanner on 02/19/2016 10:51:55 Fernando [name] ([id number]) -------------------------------------------------------------------------------- Physician Orders Details Patient Name: Fernando [name] Date of Service: 02/19/2016 9:45 AM Medical Record Number: [id number] Patient Account Number: [id number] Date of Birth/Sex: July 26, 1955 (60 y.o. Male) Treating RN: Phillis Haggis Primary Care Provider: Rolm Gala Other Clinician: Referring Provider: Rolm Gala Treating Provider/Extender: Rudene Re in Treatment: 0 Verbal / Phone Orders: Yes ClinicianAshok Cordia, Debi Read Back and Verified: Yes Diagnosis Coding ICD-10 Coding Code Description E11.622 Type 2 diabetes mellitus with other skin ulcer F17.218 Nicotine dependence, cigarettes, with other nicotine-induced disorders I89.0 Lymphedema, not elsewhere classified E66.01 Morbid (severe) obesity due to excess calories L97.222 Non-pressure chronic ulcer of left calf with fat layer exposed L97.212 Non-pressure chronic ulcer of right calf with fat layer exposed Wound  Cleansing Wound #3 Left,Lateral Lower Leg o Clean wound with Normal Saline. o Cleanse wound with mild soap and water Wound #4 Right,Anterior Lower Leg o Clean wound with Normal Saline. o Cleanse wound with mild soap and water Anesthetic Wound #3 Left,Lateral Lower Leg o Topical Lidocaine 4% cream applied to wound bed prior to debridement - clinic use Wound #4 Right,Anterior Lower Leg o Topical Lidocaine 4% cream applied to wound bed prior to debridement - clinic use Primary Wound Dressing Wound #3 Left,Lateral Lower Leg o Aquacel Ag Wound #4 Right,Anterior Lower Leg o Santyl Ointment Secondary Dressing Wound #3 Left,Lateral Lower Leg o ABD pad o Dry Gauze o XtraSorb Wound #4 Right,Anterior Lower Leg o ABD pad o Dry Gauze o XtraSorb Dressing Change Frequency Wound #3 Left,Lateral Lower Leg o Three times weekly - Monday, Wednesday, and Friday JAXZEN, VANHORN ([id number]) Wound #4 Right,Anterior Lower Leg o Three times weekly - Monday, Wednesday, and Friday Follow-up Appointments Wound #3 Left,Lateral Lower Leg o Return Appointment in 1 week. Wound #4 Right,Anterior Lower Leg o Return Appointment in 1 week. Edema Control Wound #3 Left,Lateral Lower Leg o 4 Layer Compression System - Bilateral - unna to anchor o Elevate legs to the level of the heart and pump ankles as often as possible o Other: - use compression pumps as ordered Wound #4 Right,Anterior Lower Leg o 4 Layer Compression System - Bilateral - unna to anchor o Elevate legs to the level of the heart and pump ankles as often as possible o Other: - use compression pumps as ordered Additional Orders / Instructions Wound #3 Left,Lateral Lower Leg o Increase protein intake. Wound #4 Right,Anterior Lower Leg o Increase protein intake. Home Health Wound #3 Left,Lateral Lower Leg o Initiate Home [facility] for Sk[facility] - [facility] o Home Health Nurse  may visit PRN to address patientos wound care needs. o FACE TO FACE ENCOUNTER: MEDICARE and MEDICAID PATIENTS: I certify that this patient is under my care and that I h face-to-face encounter that meets the physician face-to-face encounter requirements with this patient on this date. The e with the patient was in whole or in part for the following MEDICAL CONDITION: (primary reason for Home Healthcare) M NECESSITY: I certify, that based on my findings, NURSING services are a medically necessary home health service. HO BOUND STATUS: I certify that my clinical findings support that this patient is homebound (i.e., Due to illness or injury, p aid of supportive devices such as crutches, cane, wheelchairs, walkers, the use of special transportation or the assistan another person to leave their place of residence. There is a normal inability to leave the home and doing so requires  con and taxing effort. Other absences are for medical reasons / religious services and are infrequent or of short duration wh other reasons). o If current dressing causes regression in wound condition, may D/C ordered dressing product/s and apply Normal Saline Dressing daily until next Wound Healing Center / Other MD appointment. Notify Wound Healing Center of regression in w condition at [phone]. o Please direct any NON-WOUND related issues/requests for orders to patient's Primary Care Physician Wound #4 Right,Anterior Lower Leg o Initiate Home Health for Sk[facility] - [facility] o Home Health Nurse may visit PRN to address patientos wound care needs. o FACE TO FACE ENCOUNTER: MEDICARE and MEDICAID PATIENTS: I certify that this patient is under my care and that I h face-to-face encounter that meets the physician face-to-face encounter requirements with this patient on this date. The e with the patient was in whole or in part for the following MEDICAL CONDITION: (primary reason for Home Healthcare)  M NECESSITY: I certify, that based on my findings, NURSING services are a medically necessary home health service. HO BOUND STATUS: I certify that my clinical findings support that this patient is homebound (i.e., Due to illness or injury, p aid of supportive devices such as crutches, cane, wheelchairs, walkers, the use of special transportation or the assistan another person to leave their place of residence. There is a normal inability to leave the home and doing so requires con and taxing effort. Other absences are for medical reasons / religious services and are infrequent or of short duration wh other reasons). CREWS, MCCOLLAM ([id number]) o If current dressing causes regression in wound condition, may D/C ordered dressing product/s and apply Normal Saline Dressing daily until next Wound Healing Center / Other MD appointment. Notify Wound Healing Center of regression in w condition at [phone]. o Please direct any NON-WOUND related issues/requests for orders to patient's Primary Care Physician Medications-please add to medication list. Wound #3 Left,Lateral Lower Leg o Other: - vitamin C, zinc, vitamin A, MVI Wound #4 Right,Anterior Lower Leg o Other: - vitamin C, zinc, vitamin A, MVI Electronic Signature(s) Signed: 02/19/2016 3:53:40 PM By: Evlyn Kanner MD, FACS Signed: 02/19/2016 4:03:50 PM By: Alejandro Mulling Previous Signature: 02/19/2016 10:53:15 AM Version By: Evlyn Kanner MD, FACS Entered By: Alejandro Mulling on 02/19/2016 11:02:42 Fernando [name] ([id number]) -------------------------------------------------------------------------------- Problem List Details Patient Name: Fernando [name] Date of Service: 02/19/2016 9:45 AM Medical Record Number: [id number] Patient Account Number: [id number] Date of Birth/Sex: 24-Oct-1955 (61 y.o. Male) Treating RN: Phillis Haggis Primary Care Provider: Rolm Gala Other Clinician: Referring Provider: Rolm Gala Treating  Provider/Extender: Rudene Re in Treatment: 0 Active Problems ICD-10 Encounter Code Description Active Date Diagnosis E11.622 Type 2 diabetes mellitus with other skin ulcer 02/19/2016 Yes F17.218 Nicotine dependence, cigarettes, with other nicotine-induced disorders 02/19/2016 Yes I89.0 Lymphedema, not elsewhere classified 02/19/2016 Yes E66.01 Morbid (severe) obesity due to excess calories 02/19/2016 Yes L97.222 Non-pressure chronic ulcer of left calf with fat layer exposed 02/19/2016 Yes L97.212 Non-pressure chronic ulcer of right calf with fat layer exposed 02/19/2016 Yes Inactive Problems Resolved Problems Electronic Signature(s) Signed: 02/19/2016 3:53:40 PM By: Evlyn Kanner MD, FACS Signed: 02/19/2016 4:03:50 PM By: Alejandro Mulling Previous Signature: 02/19/2016 10:24:19 AM Version By: Evlyn Kanner MD, FACS Entered By: Alejandro Mulling on 02/19/2016 13:44:12 Fernando [name] ([id number]) -------------------------------------------------------------------------------- Progress Note Details Patient Name: Fernando [name] Date of Service: 02/19/2016 9:45 AM Medical Record Number: [id number] Patient Account Number: [id number] Date of Birth/Sex: [birth year] (61 y.o. Male) Treating RN: Phillis Haggis Primary Care  Provider: Rolm Gala Other Clinician: Referring Provider: Rolm Gala Treating Provider/Extender: Rudene Re in Treatment: 0 Subjective Chief Complaint Information obtained from Patient Patient presents to the wound care center for a consult due non healing wound Both lower extremities with swelling and has had this fo 4 years. He was seen earlier by as in November and December 2017 and was lost to follow-up via seeing him back after 2 months History of Present Illness (HPI) The following HPI elements were documented for the patient's wound: Location: ulcers both lower extremities right worse than left Quality: Patient reports experiencing a dull pain  to affected area(s). Severity: Patient states wound are getting worse. Duration: Patient has had the wound for > 3 years prior to seeking treatment at the wound center Timing: Pain in wound is Intermittent (comes and goes Context: The wound appeared gradually over time Modifying Factors: Consults to this date include:wound center at [facility] and cardiology Associated Signs and Symptoms: Patient reports having increase swelling. 61 year old gentleman who has been very noncompliant with his treatment in the past, has not seen [place] for 2 months, but in the [facility] been set up with lymphedema pumps by the vein and vascular office of Dr. Gilda Crease. He comes with a large ulceration in the right lower which is been there and much worse compared to before. He has been getting dressing changes done by his wife and now wants to re treatment under [place]. he continues to smoke about 2 packets of cigarettes a day. ====== Old notes 61 year old gentleman who has diabetes mellitus, been referred to was for use with lymphedema and peripheral vascular disease. Sta had a vascular workup ordered and has a scale opinion done recently. He is a smoker and smokes about 2 packs of cigarettes a day. past medical history significant for coronary artery disease, CHF, COPD, hyperlipidemia, hypertension, lower extremity edema, obesity addiction, status post chronically occluded right coronary artery, history of obstructive sleep apnea treated with a CPAP machine. he is status post hernia repair, tonsillectomy, appendectomy and angioplasty. Note is Made that he has been treated at the wound center for chronic lymphedema with ulceration which improves with Unna's but rec he does not have them. hemoglobin A1c was 6.6%. 11/09/2015 -- the patient has not had his vascular tests so far and from what the wife tells me his compression wraps have been slippin they need to be changed more frequently. He has several excuses for not stopping to  smoke and we have address this again. 11/30/2015 -- the patient has lost home health coverage and hence his wife and he have to do the dressings. They do have some prev ordered compression wraps which they have been using in the past. 12/18/2015 -- patient has had recent venous duplex examination done which shows no DVT no SVT or no reflux The arterial study done showed a right ABI of 1.21 and the left of 1.3 which was consistent with noncompressible arteries due to media calcification. The left great toe pressure and PPG waveform are decreased and the right great toe pressure and PPG waveforms are w normal limits. The right toe brachial index was 1.0 and the left was 0.68. He was seen by the PA at the vascular office-- Ms. Cleda Daub -- who recommended lymphedema pumps for the stage III lym cessation of smoking and as far as the peripheral arterial disease goes she recommended no intervention at this time as the ABIs were acceptable and to follow-up in a year with ABI. Wound  History JAQUAVIAN, FIRKUS ([id number]) Patient presents with 1 open wound that has been present for approximately 2 weeks. Patient has been treating wound in the following TCA. Laboratory tests have not been performed in the last month. 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 Patient reportedly has had testing performed to evaluate circulation in the le Patient History Information obtained from Patient. Allergies Augmentin, codeine, methialate Social History Current every day smoker, Marital Status - Married, Alcohol Use - Never, Drug Use - No History, Caffeine Use - Daily. Medical And Surgical History Notes Cardiovascular venous insufficiency Review of Systems (ROS) Constitutional Symptoms (General Health) The patient has no complaints or symptoms. Objective Constitutional Pulse regular. Respirations normal and  unlabored. Afebrile. Vitals Time Taken: 9:56 AM, Height: 65 in, Source: Stated, Weight: 288.4 lbs, Source: Measured, BMI: 48, Temperature: 97.6 F, Pulse Respiratory Rate: 22 breaths/min, Blood Pressure: 114/80 mmHg. Eyes Nonicteric. Reactive to light. Ears, Nose, Mouth, and Throat Lips, teeth, and gums WNL.Marland Kitchen Moist mucosa without lesions. Neck supple and nontender. No palpable supraclavicular or cervical adenopathy. Normal sized without goiter. Respiratory WNL. No retractions.. Breath sounds WNL, No rubs, rales, rhonchi, or wheeze.. Cardiovascular Pedal Pulses WNL. recent arterial duplex studies noted. lymphedema both lower extremities right worse than left. Chest Breasts symmetical and no nipple discharge.. Breast tissue WNL, no masses, lumps, or tenderness.. Gastrointestinal (GI) Abdomen without masses or tenderness.. No liver or spleen enlargement or tenderness.. Lymphatic No adneopathy. No adenopathy. No adenopathy. Musculoskeletal Adexa without tenderness or enlargement.. Digits and nails w/o clubbing, cyanosis, infection, petechiae, ischemia, or inflammatory con FIONN, STRACKE ([id number]) Psychiatric Judgement and insight Intact.. No evidence of depression, anxiety, or agitation.. General Notes: patient has stage II bilateral lymphedema with minimal ulceration and excoriation on the left lower extremity in the calf a compartment. On the right he has a large ulceration with subcutaneous debris and excessive amount of slough. Debridement was done curet. Minimal bleeding controlled with pressure Integumentary (Hair, Skin) No suspicious lesions. No crepitus or fluctuance. No peri-wound warmth or erythema. No masses.. Wound #3 status is Open. Original cause of wound was Gradually Appeared. The wound is located on the Left,Lateral Lower Leg. The measures 3.6cm length x 5.2cm width x 0.1cm depth; 14.703cm^2 area and 1.47cm^3 volume. The wound is limited to skin breakdown no tunneling  or undermining noted. There is a large amount of serous drainage noted. The wound margin is distinct with the outline atta the wound base. There is large (67-100%) red, pink granulation within the wound bed. There is a small (1-33%) amount of necrotic tiss the wound bed including Adherent Slough. The periwound skin appearance exhibited: Maceration. Periwound temperature was noted a Abnormality. Wound #4 status is Open. Original cause of wound was Gradually Appeared. The wound is located on the Right,Anterior Lower Leg. T measures 7.5cm length x 10cm width x 0.2cm depth; 58.905cm^2 area and 11.781cm^3 volume. There is Fat Layer (Subcutaneous Tis Exposed exposed. There is no tunneling noted. There is a large amount of serosanguineous drainage noted. The wound margin is dist the outline attached to the wound base. There is small (1-33%) red, pink granulation within the wound bed. There is a large (67-100%) necrotic tissue within the wound bed including Adherent Slough. The periwound skin appearance exhibited: Maceration. Periwound tem was noted as No Abnormality. The periwound has tenderness on palpation. Assessment Active Problems ICD-10 E11.622 - Type 2 diabetes mellitus with other skin ulcer F17.218 - Nicotine dependence, cigarettes,  with other nicotine-induced disorders I89.0 - Lymphedema, not elsewhere classified E66.01 - Morbid (severe) obesity due to excess calories L97.222 - Non-pressure chronic ulcer of left calf with fat layer exposed L97.212 - Non-pressure chronic ulcer of right calf with fat layer exposed 61 year old gentleman who is morbidly obese, diabetic and has significant CHF and COPD, he can barely breathe while he is sitting an significant dyspnea. He smokes about 2 packets of cigarettes a day. I have had a long discussion with the patient and his wife and have recommended: 1. silver alginate and a Profore 4 layer compression, to his left lower extremity. 2. Santyl ointment  and a Profore 4 layer compression, to his left lower extremity. 3. Elevation and exercise 4. completly giving up smoking and I have spent 3 minutes discussing this in great detail with him 5. arterial and venous duplex studies have already been done and reports reviewed 6. Control of his diabetes mellitus and CHF 7. 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 There was a Skin/Subcutaneous Tissue Debridement (1104 debridement with total area of 75 sq cm performed by Evlyn Kanner, MD. with the following instrument(s): Curette to remove Viable and ROE WILNER, Chevez ([id number]) tissue/material including Exudate, Fibrin/Slough, and Subcutaneous after achieving pain control using Lidocaine 4% Topical Solution. A was conducted at 10:33, prior to the start of the procedure. A Minimum amount of bleeding was controlled with Pressure. The procedur tolerated well with a pain level of 0 throughout and a pain level of 0 following the procedure. Post Debridement Measurements: 7.5cm l 10cm width x 0.3cm depth; 17.671cm^3 volume. Character of Wound/Ulcer Post Debridement requires further debridement. Severity of Tissue Post Debridement is: Fat layer exposed. Post procedure Diagnosis Wound #4: Same as Pre-Procedure Plan Wound Cleansing: Wound #3 Left,Lateral Lower Leg: Clean wound with Normal Saline. Cleanse wound with mild soap and water Wound #4 Right,Anterior Lower Leg: Clean wound with Normal Saline. Cleanse wound with mild soap and water Anesthetic: Wound #3 Left,Lateral Lower Leg: Topical Lidocaine 4% cream applied to wound bed prior to debridement - clinic use Wound #4 Right,Anterior Lower Leg: Topical Lidocaine 4% cream applied to wound bed prior to debridement - clinic use Primary Wound Dressing: Wound #3 Left,Lateral Lower Leg: Aquacel Ag Wound #4 Right,Anterior Lower Leg: Santyl Ointment Secondary  Dressing: Wound #3 Left,Lateral Lower Leg: ABD pad Dry Gauze XtraSorb Wound #4 Right,Anterior Lower Leg: ABD pad Dry Gauze XtraSorb Dressing Change Frequency: Wound #3 Left,Lateral Lower Leg: Three times weekly - Monday, Wednesday, and Friday Wound #4 Right,Anterior Lower Leg: Three times weekly - Monday, Wednesday, and Friday Follow-up Appointments: Wound #3 Left,Lateral Lower Leg: Return Appointment in 1 week. Wound #4 Right,Anterior Lower Leg: Return Appointment in 1 week. Edema Control: Wound #3 Left,Lateral Lower Leg: 4 Layer Compression System - Bilateral - unna to anchor Elevate legs to the level of the heart and pump ankles as often as possible Other: - use compression pumps as ordered Wound #4 Right,Anterior Lower Leg: 4 Layer Compression System - Bilateral - unna to anchor Elevate legs to the level of the heart and pump ankles as often as possible Other: - use compression pumps as ordered Additional Orders / Instructions: Wound #3 Left,Lateral Lower Leg: Increase protein intake. Wound #4 Right,Anterior Lower Leg: LES, LONGMORE ([id number]) Increase protein intake. Home Health: Wound #3 Left,Lateral Lower Leg: Initiate Home Health for Skilled Nursing - [facility] Nurse may  visit PRN to address patient s wound care needs. FACE TO FACE ENCOUNTER: MEDICARE and MEDICAID PATIENTS: I certify that this patient is under my care and that I had a face encounter that meets the physician face-to-face encounter requirements with this patient on this date. The encounter with the patient w whole or in part for the following MEDICAL CONDITION: (primary reason for Home Healthcare) MEDICAL NECESSITY: I certify, that b my findings, NURSING services are a medically necessary home health service. HOME BOUND STATUS: I certify that my clinical find support that this patient is homebound (i.e., Due to illness or injury, pt requires aid of supportive devices such as  crutches, cane, whee walkers, the use of special transportation or the assistance of another person to leave their place of residence. There is a normal inabil the home and doing so requires considerable and taxing effort. Other absences are for medical reasons / religious services and are inf of short duration when for other reasons). If current dressing causes regression in wound condition, may D/C ordered dressing product/s and apply Normal Saline Moist Dressin until next Wound Healing Center / Other MD appointment. Notify Wound Healing Center of regression in wound condition at [phone] Please direct any NON-WOUND related issues/requests for orders to patient's Primary Care Physician Wound #4 Right,Anterior Lower Leg: Initiate Home Health for Skilled Nursing - [facility] Nurse may visit PRN to address patient s wound care needs. FACE TO FACE ENCOUNTER: MEDICARE and MEDICAID PATIENTS: I certify that this patient is under my care and that I had a face encounter that meets the physician face-to-face encounter requirements with this patient on this date. The encounter with the patient w whole or in part for the following MEDICAL CONDITION: (primary reason for Home Healthcare) MEDICAL NECESSITY: I certify, that b my findings, NURSING services are a medically necessary home health service. HOME BOUND STATUS: I certify that my clinical find support that this patient is homebound (i.e., Due to illness or injury, pt requires aid of supportive devices such as crutches, cane, whee walkers, the use of special transportation or the assistance of another person to leave their place of residence. There is a normal inabil the home and doing so requires considerable and taxing effort. Other absences are for medical reasons / religious services and are inf of short duration when for other reasons). If current dressing causes regression in wound condition, may D/C ordered dressing product/s and  apply Normal Saline Moist Dressin until next Wound Healing Center / Other MD appointment. Notify Wound Healing Center of regression in wound condition at [phone] Please direct any NON-WOUND related issues/requests for orders to patient's Primary Care Physician Medications-please add to medication list.: Wound #3 Left,Lateral Lower Leg: Other: - vitamin C, zinc, vitamin A, MVI Wound #4 Right,Anterior Lower Leg: Other: - vitamin C, zinc, vitamin A, MVI 61 year old gentleman who is morbidly obese, diabetic and has significant CHF and COPD, he can barely breathe while he is sitting an significant dyspnea. He smokes about 2 packets of cigarettes a day. I have had a long discussion with the patient and his wife and have recommended: 1. silver alginate and a Profore 4 layer compression, to his left lower extremity. 2. Santyl ointment and a Profore 4 layer compression, to his left lower extremity. 3. Elevation and exercise 4. completly giving up smoking and I have spent 3 minutes discussing this in great detail with him 5. arterial and venous duplex studies have already been done and reports  reviewed 6. Control of his diabetes mellitus and CHF 7. regular visits to the wound center Electronic Signature(s) Signed: 03/04/2016 2:35:21 PM By: Elliot Gurney, RN, BSN, Kim RN, BSN Signed: 03/04/2016 3:39:05 PM By: Evlyn Kanner MD, FACS Previous Signature: 02/19/2016 3:57:55 PM Version By: Evlyn Kanner MD, FACS Previous Signature: 02/19/2016 10:57:23 AM Version By: Evlyn Kanner MD, FACS Entered By: Elliot Gurney RN, BSN, Kim on 03/04/2016 14:28:09 Fernando [name] ([id number]) -------------------------------------------------------------------------------- ROS/PFSH Details Patient Name: Fernando [name] Date of Service: 02/19/2016 9:45 AM Medical Record Number: [id number] Patient Account Number: [id number] Date of Birth/Sex: Jul 26, 1955 (61 y.o. Male) Treating RN: Phillis Haggis Primary Care Provider: Rolm Gala Other Clinician: Referring Provider: Rolm Gala Treating Provider/Extender: Rudene Re in Treatment: 0 Information Obtained From Patient Wound History Do you currently have one or more open woundso Yes How many open wounds do you currently haveo 1 Approximately how long have you had your woundso 2 weeks How have you been treating your wound(s) until nowo TCA Has your wound(s) ever healed and then re-openedo No Have you had any lab work done in the past montho No Have you tested positive for osteomyelitis (bone infection)o No Have you had any tests for circulation on your legso Yes Who ordered the testo Dr schnier [facility] test [facility] Constitutional Symptoms (General Health) Complaints and Symptoms: No Complaints or Symptoms Hematologic/Lymphatic Medical History: Positive for: Lymphedema Respiratory Medical History: Positive for: Chronic Obstructive Pulmonary Disease (COPD); Sleep Apnea - CPAP Cardiovascular Medical History: Positive for: Congestive Heart Failure; Hypertension; Peripheral Arterial Disease Past Medical History Notes: venous insufficiency Endocrine Medical History: Positive for: Type II Diabetes Oncologic Medical History: Negative for: Received Chemotherapy; Received Radiation Immunizations Pneumococcal Vaccine: Received Pneumococcal Vaccination: Yes Immunization Notes: up to date Family and Social History BARRINGTON, WORLEY ([id number]) Current every day smoker; Marital Status - Married; Alcohol Use: Never; Drug Use: No History; Caffeine Use: Daily; Financial Concern Food, [employer] fast streamer or Shelter Needs: No; Support System Lacking: No; Transportation Concerns: No; Advanced Directives: No; Patient do want information on Advanced Directives; Living Will: No; Medical Power of Attorney: No Physician Affirmation I have reviewed and agree with the above information. Electronic Signature(s) Signed: 02/19/2016 3:53:40 PM By: Evlyn Kanner  MD, FACS Signed: 02/19/2016 4:03:50 PM By: Alejandro Mulling Entered By: Evlyn Kanner on 02/19/2016 10:19:11 Fernando [name] ([id number]) -------------------------------------------------------------------------------- SuperBill Details Patient Name: Fernando [name] Date of Service: 02/19/2016 Medical Record Number: [id number] Patient Account Number: [id number] Date of Birth/Sex: [birth year] (61 y.o. Male) Treating RN: Phillis Haggis Primary Care Provider: Rolm Gala Other Clinician: Referring Provider: Rolm Gala Treating Provider/Extender: Evlyn Kanner Service Line: Outpatient Weeks in Treatment: 0 Diagnosis Coding ICD-10 Codes Code Description E11.622 Type 2 diabetes mellitus with other skin ulcer F17.218 Nicotine dependence, cigarettes, with other nicotine-induced disorders I89.0 Lymphedema, not elsewhere classified E66.01 Morbid (severe) obesity due to excess calories L97.222 Non-pressure chronic ulcer of left calf with fat layer exposed L97.212 Non-pressure chronic ulcer of right calf with fat layer exposed Facility Procedures CPT4 Code: 03474259 Description: 99213 - WOUND CARE VISIT-LEV 3 EST PT Modifier: Q: CPT4 Code: 56387564 Description: 11042 - DEB SUBQ TISSUE 20 SQ CM/< ICD-10 Description Diagnosis E11.622 Type 2 diabetes mellitus with other skin ulcer L97.222 Non-pressure chronic ulcer of left calf with fat layer exposed L97.212 Non-pressure chronic ulcer of right calf  with fat layer exposed I89.0 Lymphedema, not elsewhere classified Modifier: Q: CPT4 Code: 33295188 Description: 11045 - DEB SUBQ TISS EA ADDL 20CM ICD-10 Description Diagnosis E11.622 Type 2 diabetes mellitus with other  skin ulcer I89.0 Lymphedema, not elsewhere classified L97.222 Non-pressure chronic ulcer of left calf with fat layer exposed L97.212  Non-pressure chronic ulcer of right calf with fat layer exposed Modifier: Q: CPT4 Code: 16109604 Description: 99406-SMOKING CESSATION  3-10MINS ICD-10 Description Diagnosis E11.622 Type 2 diabetes mellitus with other skin ulcer F17.218 Nicotine dependence, cigarettes, with other nicotine-induced disorders I89.0 Lymphedema, not elsewhere classified Modifier: Q: Physician Procedures CPT4 Code: 5409811 Fernando [name] Description: 99214 - WC PHYS LEVEL 4 - EST PT ICD-10 Description Diagnosis E11.622 Type 2 diabetes mellitus with other skin ulcer L97.222 Non-pressure chronic ulcer of left calf with fat layer exposed I89.0 Lymphedema, not elsewhere classified  ([id number]) Modifier: 25 Electronic Signature(s) Signed: 02/19/2016 3:53:40 PM By: Evlyn Kanner MD, FACS Signed: 02/19/2016 4:03:50 PM By: Alejandro Mulling Previous Signature: 02/19/2016 10:58:17 AM Version By: Evlyn Kanner MD, FACS Entered By: Alejandro Mulling on 02/19/2016 13:44:01

## 2016-02-20 NOTE — Progress Notes (Signed)
Fernando [name], Tavian ([id number]) Visit Report for 02/19/2016 Abuse/Suicide Risk Screen Details Patient Name: Fernando [name], Dravyn Date of Service: 02/19/2016 9:45 AM Medical Record Number: [id number] Patient Account Number: [id number] Date of Birth/Sex: 1955/07/06 36(60 y.o. Male) Treating RN: Phillis HaggisPinkerton, Debi Primary Care Myrah Strawderman: Rolm GalaGrandis, Heidi Other Clinician: Referring Jacarra Bobak: Rolm GalaGrandis, Heidi Treating Alverto Shedd/Extender: Rudene ReBritto, Errol Weeks in Treatment: 0 Abuse/Suicide Risk Screen Items Answer ABUSE/SUICIDE RISK SCREEN: Has anyone close to you tried to hurt or harm you recentlyo No Do you feel uncomfortable with anyone in your familyo No Has anyone forced you do things that you didnot want to doo No Do you have any thoughts of harming yourselfo No Patient displays signs or symptoms of abuse and/or neglect. No Electronic Signature(s) Signed: 02/19/2016 4:03:50 PM By: Alejandro MullingPinkerton, Debra Entered By: Alejandro MullingPinkerton, Debra on 02/19/2016 10:00:29 Fernando [name], Aiven ([id number]) -------------------------------------------------------------------------------- Activities of Daily Living Details Patient Name: Fernando [name], Pratham Date of Service: 02/19/2016 9:45 AM Medical Record Number: [id number] Patient Account Number: [id number] Date of Birth/Sex: 1955/07/06 40(60 y.o. Male) Treating RN: Phillis HaggisPinkerton, Debi Primary Care Tanesia Butner: Rolm GalaGrandis, Heidi Other Clinician: Referring Ketrina Boateng: Rolm GalaGrandis, Heidi Treating Shaye Elling/Extender: Rudene ReBritto, Errol Weeks in Treatment: 0 Activities of Daily Living Items Answer Activities of Daily Living (Please select one for each item) Drive Automobile Completely Able Take Medications Completely Able Use Telephone Completely Able Care for Appearance Completely Able Use Toilet Completely Able Bath / Shower Completely Able Dress Self Completely Able Feed Self Completely Able Walk Completely Able Get In / Out Bed Completely Able Housework Need Assistance Prepare Meals Completely  Able Handle Money Completely Able Shop for Self Need Assistance Electronic Signature(s) Signed: 02/19/2016 4:03:50 PM By: Alejandro MullingPinkerton, Debra Entered By: Alejandro MullingPinkerton, Debra on 02/19/2016 10:01:56 Fernando [name], Ousmane ([id number]) -------------------------------------------------------------------------------- Education Assessment Details Patient Name: Fernando [name], Carlester Date of Service: 02/19/2016 9:45 AM Medical Record Number: [id number] Patient Account Number: [id number] Date of Birth/Sex: 1955/07/06 23(60 y.o. Male) Treating RN: Phillis HaggisPinkerton, Debi Primary Care Javonne Louissaint: Rolm GalaGrandis, Heidi Other Clinician: Referring Dorraine Ellender: Rolm GalaGrandis, Heidi Treating Kanai Hilger/Extender: Rudene ReBritto, Errol Weeks in Treatment: 0 Primary Learner Assessed: Patient Learning Preferences/Education Level/Primary Language Learning Preference: Explanation, Printed Material Highest Education Level: College or Above Preferred Language: English Cognitive Barrier Assessment/Beliefs Language Barrier: No Translator Needed: No Memory Deficit: No Emotional Barrier: No Cultural/Religious Beliefs Affecting Medical No Care: Physical Barrier Assessment Impaired Vision: No Impaired Hearing: No Decreased Hand dexterity: No Knowledge/Comprehension Assessment Knowledge Level: Medium Comprehension Level: Medium Ability to understand written Medium instructions: Ability to understand verbal Medium instructions: Motivation Assessment Anxiety Level: Calm Cooperation: Cooperative Education Importance: Acknowledges Need Interest in Health Problems: Asks Questions Perception: Coherent Willingness to Engage in Self- High Management Activities: Readiness to Engage in Self- High Management Activities: Electronic Signature(s) Fernando [name], Lamarco ([id number]) Signed: 02/19/2016 4:03:50 PM By: Alejandro MullingPinkerton, Debra Entered By: Alejandro MullingPinkerton, Debra on 02/19/2016 10:01:59 Fernando [name], Brent  ([id number]) -------------------------------------------------------------------------------- Fall Risk Assessment Details Patient Name: Fernando [name], Kohl Date of Service: 02/19/2016 9:45 AM Medical Record Number: [id number] Patient Account Number: [id number] Date of Birth/Sex: 1955/07/06 67(60 y.o. Male) Treating RN: Phillis HaggisPinkerton, Debi Primary Care [place] Perra: Rolm GalaGrandis, Heidi Other Clinician: Referring Keirsten Matuska: Rolm GalaGrandis, Heidi Treating Tyannah Sane/Extender: Rudene ReBritto, Errol Weeks in Treatment: 0 Fall Risk Assessment Items Have you had 2 or more falls in the last 12 monthso 0 No Have you had any fall that resulted in injury in the last 12 monthso 0 No FALL RISK ASSESSMENT: History of falling - immediate or within 3 months 0 No Secondary diagnosis 15 Yes Ambulatory aid None/bed rest/wheelchair/nurse 0 No Crutches/cane/walker 0 No Furniture 0 No IV Access/Saline Lock 0 No Gait/Training  Normal/bed rest/immobile 0 No Weak 0 No Impaired 20 Yes Mental Status Oriented to own ability 0 Yes Electronic Signature(s) Signed: 02/19/2016 4:03:50 PM By: Alejandro Mulling Entered By: Alejandro Mulling on 02/19/2016 10:02:14 Fernando Mannan ([id number]) -------------------------------------------------------------------------------- Foot Assessment Details Patient Name: Fernando Mannan Date of Service: 02/19/2016 9:45 AM Medical Record Number: [id number] Patient Account Number: [id number] Date of Birth/Sex: Aug 04, 1955 (61 y.o. Male) Treating RN: Phillis Haggis Primary Care Genesis Paget: Rolm Gala Other Clinician: Referring Lexine Jaspers: Rolm Gala Treating Stran Raper/Extender: Rudene Re in Treatment: 0 Foot Assessment Items Site Locations + = Sensation present, - = Sensation absent, C = Callus, U = Ulcer R = Redness, W = Warmth, M = Maceration, PU = Pre-ulcerative lesion F = Fissure, S = Swelling, D = Dryness Assessment Right: Left: Other Deformity: No No Prior Foot Ulcer: No No Prior  Amputation: No No Charcot Joint: No No Ambulatory Status: Ambulatory Without Help Gait: Steady Electronic Signature(s) Signed: 02/19/2016 4:03:50 PM By: Alejandro Mulling Entered By: Alejandro Mulling on 02/19/2016 10:09:29 Fernando Mannan ([id number]) -------------------------------------------------------------------------------- Nutrition Risk Assessment Details Patient Name: Fernando Mannan Date of Service: 02/19/2016 9:45 AM Medical Record Number: [id number] Patient Account Number: [id number] Date of Birth/Sex: 1955-01-15 (61 y.o. Male) Treating RN: Phillis Haggis Primary Care Demetrice Combes: Rolm Gala Other Clinician: Referring Jaley Yan: Rolm Gala Treating Neal Oshea/Extender: Rudene Re in Treatment: 0 Height (in): 65 Weight (lbs): 288.4 Body Mass Index (BMI): 48 Nutrition Risk Assessment Items NUTRITION RISK SCREEN: I have an illness or condition that made me change the kind and/or 0 No amount of food I eat I eat fewer than two meals per day 0 No I eat few fruits and vegetables, or milk products 0 No I have three or more drinks of beer, liquor or wine almost every day 0 No I have tooth or mouth problems that make it hard for me to eat 0 No I don't always have enough money to buy the food I need 0 No I eat alone most of the time 0 No I take three or more different prescribed or over-the-counter drugs a 1 Yes day Without wanting to, I have lost or gained 10 pounds in the last six 0 No months I am not always physically able to shop, cook and/or feed myself 0 No Nutrition Protocols Good Risk Protocol Moderate Risk Protocol Electronic Signature(s) Signed: 02/19/2016 4:03:50 PM By: Alejandro Mulling Entered By: Alejandro Mulling on 02/19/2016 10:02:23

## 2016-02-25 ENCOUNTER — Ambulatory Visit: Payer: PPO | Admitting: Surgery

## 2016-03-19 DIAGNOSIS — J449 Chronic obstructive pulmonary disease, unspecified: Secondary | ICD-10-CM | POA: Diagnosis not present

## 2016-04-19 DIAGNOSIS — J449 Chronic obstructive pulmonary disease, unspecified: Secondary | ICD-10-CM | POA: Diagnosis not present

## 2016-05-13 IMAGING — MR MR FOOT*R* WO/W CM
9 series · 37 of 40 positions shown · IV contrast (multihance)
Comparison: 07/07/2014

CLINICAL DATA: Patient stepped on bar wire. Pain and swelling in
the foot. Symptoms for 8 days.

EXAM:
MRI OF THE RIGHT FOOT WITHOUT AND WITH CONTRAST
TECHNIQUE: Multiplanar, multisequence MR imaging was performed both before and
after administration of intravenous contrast. Osteomyelitis whole
foot protocol was performed.
CONTRAST:  20mL MULTIHANCE GADOBENATE DIMEGLUMINE 529 MG/ML IV SOLN

[Series 3: T1 · coronal · 3.0mm · 0.94mm/px · 5 of 55 slices shown (1 of 2)]
[im 1/55]
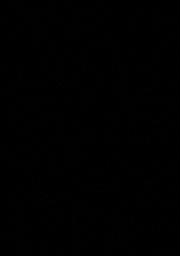
[im 14/55]
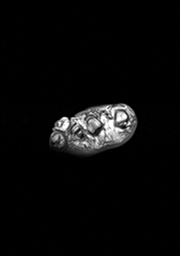
[im 28/55]
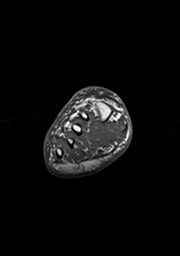
[im 41/55]
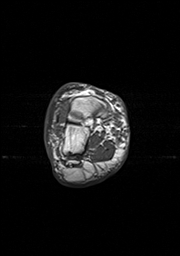
[im 55/55]
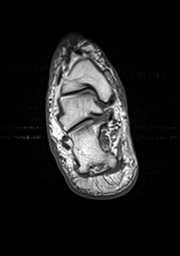

[Series 5: T2 fat-sat · coronal · 3.0mm · 0.43mm/px · 6 of 55 slices shown]
[im 1/55]
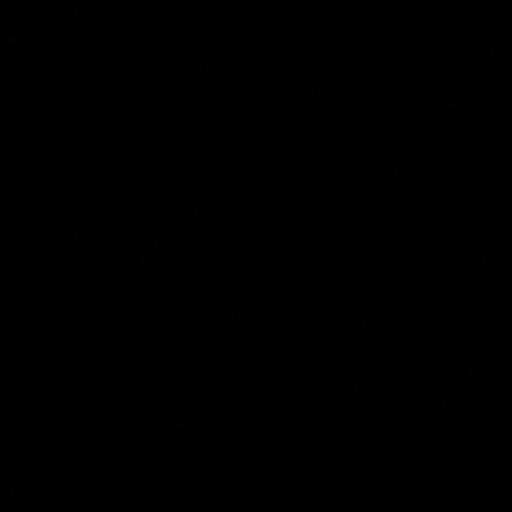
[im 11/55]
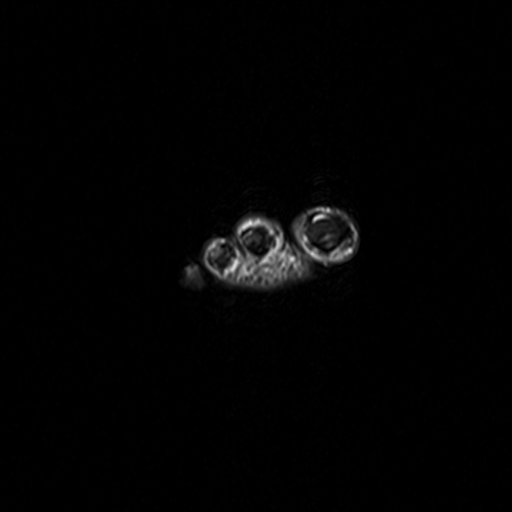
[im 22/55]
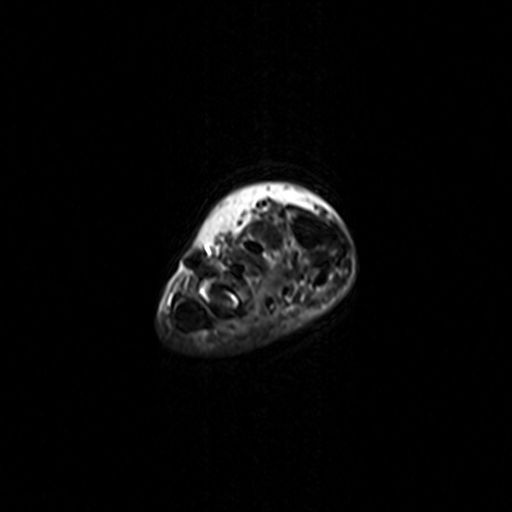
[im 33/55]
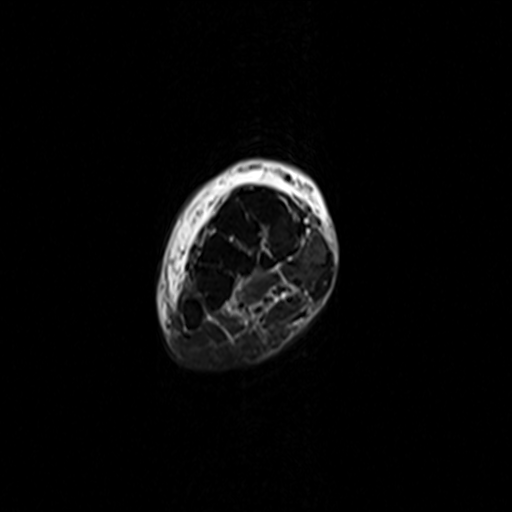
[im 44/55]
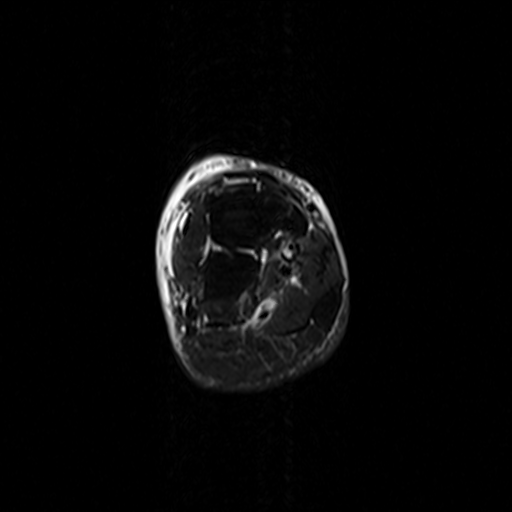
[im 55/55]
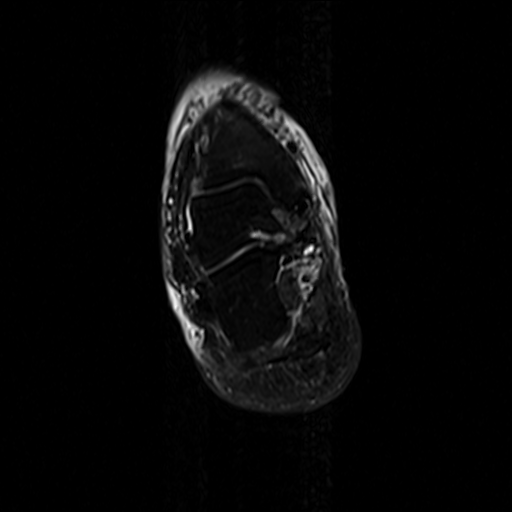

[Series 6: T1 · oblique · 3.0mm · 0.78mm/px · 3 of 30 slices shown (2 of 2)]
[im 1/30]
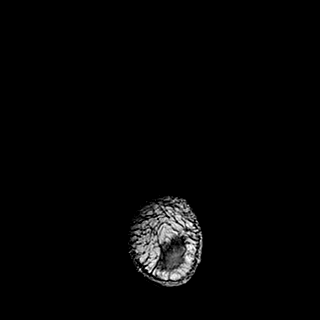
[im 15/30]
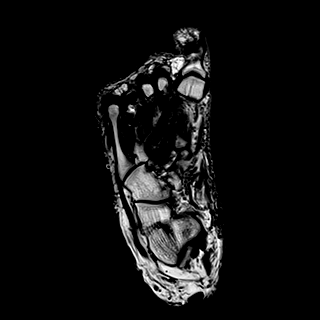
[im 30/30]
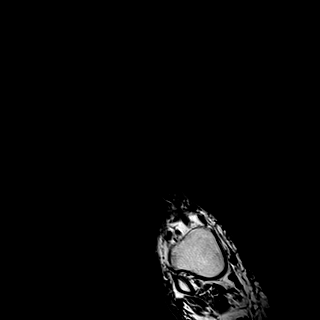

[Series 7: STIR · oblique · 3.0mm · 0.49mm/px · 3 of 30 slices shown (1 of 2)]
[im 1/30]
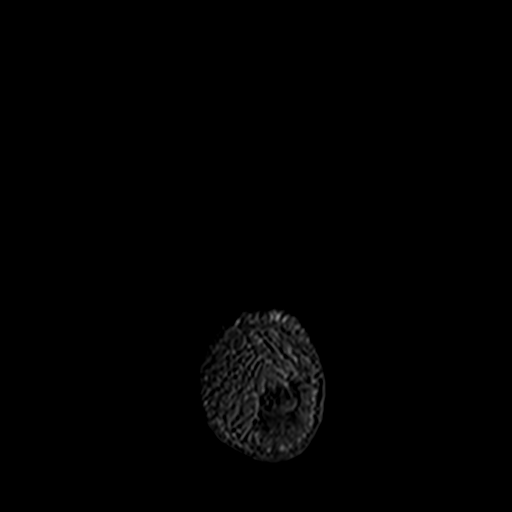
[im 15/30]
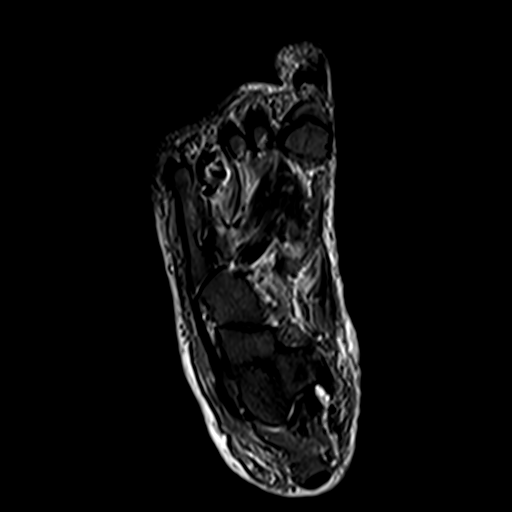
[im 30/30]
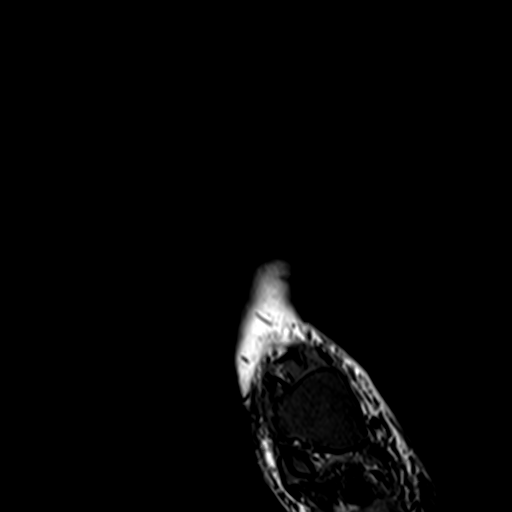

[Series 8: STIR · sagittal · 3.0mm · 0.45mm/px · 1 of 36 slices shown (2 of 2)]
[im 1/36]
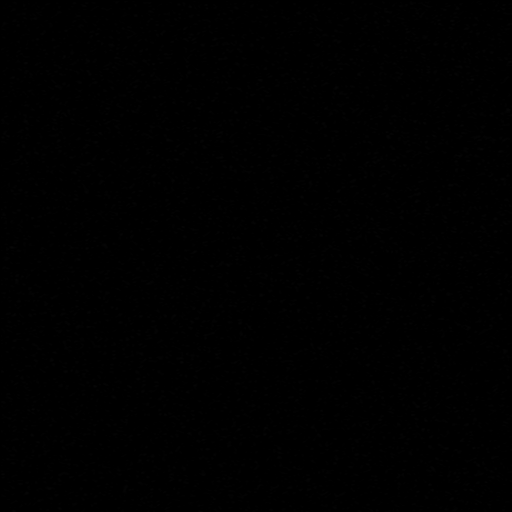

[Series 9: T1 fat-sat · coronal · 3.0mm · 0.94mm/px · 6 of 55 slices shown (1 of 4)]
[im 1/55]
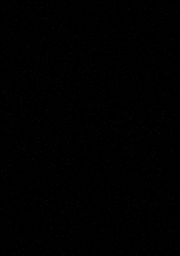
[im 11/55]
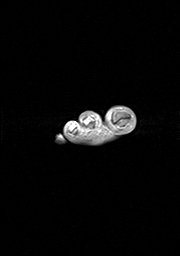
[im 22/55]
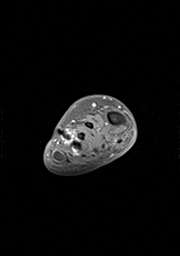
[im 33/55]
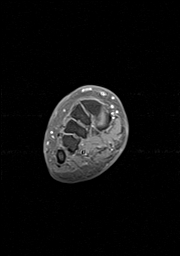
[im 44/55]
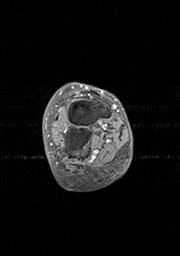
[im 55/55]
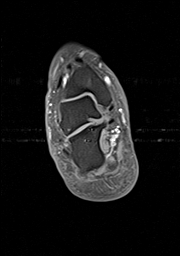

[Series 10: T1 fat-sat · coronal · 3.0mm · 0.94mm/px · 6 of 55 slices shown (2 of 4)]
[im 1/55]
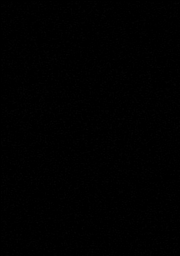
[im 11/55]
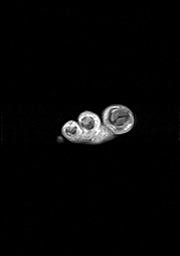
[im 22/55]
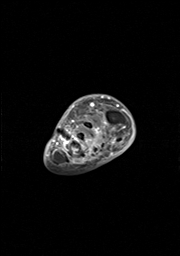
[im 33/55]
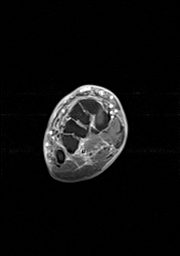
[im 44/55]
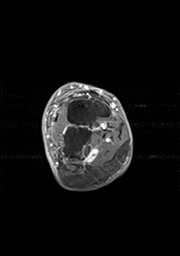
[im 55/55]
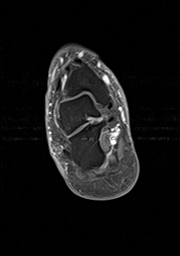

[Series 11: T1 fat-sat · oblique · 3.0mm · 0.98mm/px · 3 of 30 slices shown (3 of 4)]
[im 1/30]
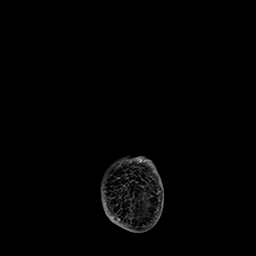
[im 15/30]
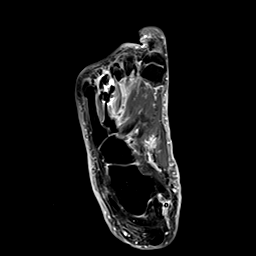
[im 30/30]
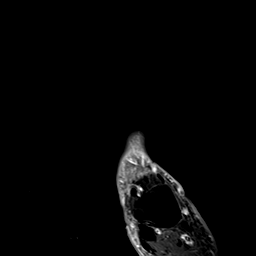

[Series 12: T1 fat-sat · sagittal · 3.0mm · 0.98mm/px · 4 of 35 slices shown (4 of 4)]
[im 1/35]
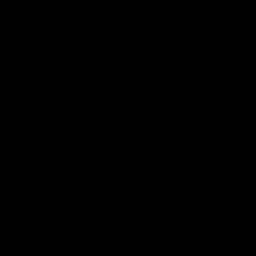
[im 12/35]
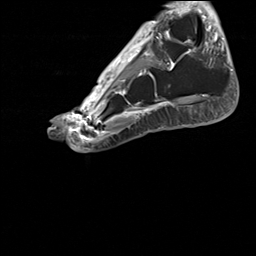
[im 23/35]
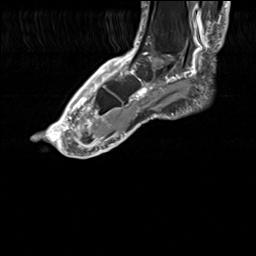
[im 35/35]
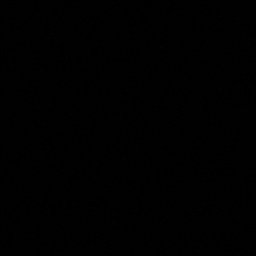

[37 of 40 positions shown; findings below may reference images not displayed]

FINDINGS: Osteomyelitis protocol MRI of the foot was obtained, to include the
entire foot and ankle. This protocol uses a large field of view to
cover the entire foot and ankle, and is suitable for assessing bony
structures for osteomyelitis. Due to the large field of view and
imaging plane choice, this protocol is less sensitive for assessing
small structures such as ligamentous structures of the foot and
ankle, compared to a dedicated forefoot or dedicated hindfoot exam.

Subcutaneous edema and enhancement along dorsum of the foot.
Abnormal enhancement tracks within the plantar musculature of the
foot.

The fourth metatarsal appears shortened. Metal type susceptibility
artifact observed in the vicinity of the fourth metatarsal head and
fourth MTP joint tracking from the plantar soft tissues to the
dorsal soft tissues as shown on images 29 through 39 of series 9.
Although I do not see metal foreign bodies in this vicinity on
recent radiographs, the possibility of microscopic metal particles
related to prior operative intervention is raised -correlate with
prior operative history. The becomes difficult to exclude the
possibility of gas in the soft tissues in this vicinity although I
favor metal artifact given the appearance.

I do not observe a drainable abscess. No significant abnormal
osseous edema or definite osseous enhancement to suggest
osteomyelitis.
IMPRESSION: 1. Dorsal subcutaneous edema in the foot favoring cellulitis given
the associated enhancement. Low-grade enhancement along the plantar
muscular structures suggesting myositis, without a drainable abscess
observed. No osseous edema/enhancement to suggest osteomyelitis.
2. Foci of susceptibility artifact extending from the dorsal to the
plantar foot in the vicinity of the fourth MTP joint. The fourth
metatarsal is chronically shortened and normally I would suspect
that the appearance is postoperative due to microscopic metal
particles. However, there is no note of any prior operation in the
patient's recent consult note or history and physical note.
Accordingly the possibility that these foci of low signal intensity
might represent gas bubbles in the soft tissues is not entirely
discounted. I do not observe gas in the soft tissues on the
radiograph from 07/07/2014. Repeat radiography of the foot to ensure
that there are no gas bubbles in this vicinity is recommended.

## 2016-05-19 DIAGNOSIS — J449 Chronic obstructive pulmonary disease, unspecified: Secondary | ICD-10-CM | POA: Diagnosis not present

## 2016-06-19 DIAGNOSIS — J449 Chronic obstructive pulmonary disease, unspecified: Secondary | ICD-10-CM | POA: Diagnosis not present

## 2016-07-05 DIAGNOSIS — I5032 Chronic diastolic (congestive) heart failure: Secondary | ICD-10-CM | POA: Diagnosis not present

## 2016-07-05 DIAGNOSIS — I890 Lymphedema, not elsewhere classified: Secondary | ICD-10-CM | POA: Diagnosis not present

## 2016-07-05 DIAGNOSIS — I2510 Atherosclerotic heart disease of native coronary artery without angina pectoris: Secondary | ICD-10-CM | POA: Diagnosis not present

## 2016-07-05 DIAGNOSIS — I872 Venous insufficiency (chronic) (peripheral): Secondary | ICD-10-CM | POA: Diagnosis not present

## 2016-07-05 DIAGNOSIS — E782 Mixed hyperlipidemia: Secondary | ICD-10-CM | POA: Diagnosis not present

## 2016-07-05 DIAGNOSIS — I10 Essential (primary) hypertension: Secondary | ICD-10-CM | POA: Diagnosis not present

## 2016-07-19 DIAGNOSIS — J449 Chronic obstructive pulmonary disease, unspecified: Secondary | ICD-10-CM | POA: Diagnosis not present

## 2016-07-26 DIAGNOSIS — I10 Essential (primary) hypertension: Secondary | ICD-10-CM | POA: Diagnosis not present

## 2016-07-26 DIAGNOSIS — E782 Mixed hyperlipidemia: Secondary | ICD-10-CM | POA: Diagnosis not present

## 2016-07-26 DIAGNOSIS — E669 Obesity, unspecified: Secondary | ICD-10-CM | POA: Diagnosis not present

## 2016-07-26 DIAGNOSIS — E1169 Type 2 diabetes mellitus with other specified complication: Secondary | ICD-10-CM | POA: Diagnosis not present

## 2016-07-26 DIAGNOSIS — F17200 Nicotine dependence, unspecified, uncomplicated: Secondary | ICD-10-CM | POA: Diagnosis not present

## 2016-07-26 DIAGNOSIS — Z79899 Other long term (current) drug therapy: Secondary | ICD-10-CM | POA: Diagnosis not present

## 2016-07-26 DIAGNOSIS — I2510 Atherosclerotic heart disease of native coronary artery without angina pectoris: Secondary | ICD-10-CM | POA: Diagnosis not present

## 2016-07-26 DIAGNOSIS — G4733 Obstructive sleep apnea (adult) (pediatric): Secondary | ICD-10-CM | POA: Diagnosis not present

## 2016-07-26 DIAGNOSIS — E538 Deficiency of other specified B group vitamins: Secondary | ICD-10-CM | POA: Diagnosis not present

## 2016-07-27 DIAGNOSIS — F17200 Nicotine dependence, unspecified, uncomplicated: Secondary | ICD-10-CM | POA: Diagnosis not present

## 2016-07-27 DIAGNOSIS — G4733 Obstructive sleep apnea (adult) (pediatric): Secondary | ICD-10-CM | POA: Diagnosis not present

## 2016-07-27 DIAGNOSIS — E538 Deficiency of other specified B group vitamins: Secondary | ICD-10-CM | POA: Diagnosis not present

## 2016-07-27 DIAGNOSIS — I10 Essential (primary) hypertension: Secondary | ICD-10-CM | POA: Diagnosis not present

## 2016-07-27 DIAGNOSIS — E1169 Type 2 diabetes mellitus with other specified complication: Secondary | ICD-10-CM | POA: Diagnosis not present

## 2016-07-27 DIAGNOSIS — E669 Obesity, unspecified: Secondary | ICD-10-CM | POA: Diagnosis not present

## 2016-07-27 DIAGNOSIS — Z79899 Other long term (current) drug therapy: Secondary | ICD-10-CM | POA: Diagnosis not present

## 2016-07-27 DIAGNOSIS — E782 Mixed hyperlipidemia: Secondary | ICD-10-CM | POA: Diagnosis not present

## 2016-07-27 DIAGNOSIS — I2510 Atherosclerotic heart disease of native coronary artery without angina pectoris: Secondary | ICD-10-CM | POA: Diagnosis not present

## 2016-08-19 DIAGNOSIS — J449 Chronic obstructive pulmonary disease, unspecified: Secondary | ICD-10-CM | POA: Diagnosis not present

## 2016-09-07 DIAGNOSIS — E538 Deficiency of other specified B group vitamins: Secondary | ICD-10-CM | POA: Diagnosis not present

## 2016-09-07 DIAGNOSIS — E782 Mixed hyperlipidemia: Secondary | ICD-10-CM | POA: Diagnosis not present

## 2016-09-19 DIAGNOSIS — J449 Chronic obstructive pulmonary disease, unspecified: Secondary | ICD-10-CM | POA: Diagnosis not present

## 2016-10-07 DIAGNOSIS — E538 Deficiency of other specified B group vitamins: Secondary | ICD-10-CM | POA: Diagnosis not present

## 2016-10-19 DIAGNOSIS — J449 Chronic obstructive pulmonary disease, unspecified: Secondary | ICD-10-CM | POA: Diagnosis not present

## 2016-11-09 DIAGNOSIS — E538 Deficiency of other specified B group vitamins: Secondary | ICD-10-CM | POA: Diagnosis not present

## 2016-11-14 DIAGNOSIS — Z23 Encounter for immunization: Secondary | ICD-10-CM | POA: Diagnosis not present

## 2016-12-14 DIAGNOSIS — I5032 Chronic diastolic (congestive) heart failure: Secondary | ICD-10-CM | POA: Diagnosis not present

## 2016-12-14 DIAGNOSIS — I890 Lymphedema, not elsewhere classified: Secondary | ICD-10-CM | POA: Diagnosis not present

## 2016-12-14 DIAGNOSIS — G4733 Obstructive sleep apnea (adult) (pediatric): Secondary | ICD-10-CM | POA: Diagnosis not present

## 2016-12-14 DIAGNOSIS — I872 Venous insufficiency (chronic) (peripheral): Secondary | ICD-10-CM | POA: Diagnosis not present

## 2016-12-14 DIAGNOSIS — E782 Mixed hyperlipidemia: Secondary | ICD-10-CM | POA: Diagnosis not present

## 2016-12-14 DIAGNOSIS — I10 Essential (primary) hypertension: Secondary | ICD-10-CM | POA: Diagnosis not present

## 2016-12-14 DIAGNOSIS — I2510 Atherosclerotic heart disease of native coronary artery without angina pectoris: Secondary | ICD-10-CM | POA: Diagnosis not present

## 2017-01-05 DIAGNOSIS — E538 Deficiency of other specified B group vitamins: Secondary | ICD-10-CM | POA: Diagnosis not present

## 2017-01-06 DIAGNOSIS — E669 Obesity, unspecified: Secondary | ICD-10-CM | POA: Diagnosis not present

## 2017-01-06 DIAGNOSIS — L97909 Non-pressure chronic ulcer of unspecified part of unspecified lower leg with unspecified severity: Secondary | ICD-10-CM | POA: Diagnosis not present

## 2017-01-06 DIAGNOSIS — I87303 Chronic venous hypertension (idiopathic) without complications of bilateral lower extremity: Secondary | ICD-10-CM | POA: Diagnosis not present

## 2017-01-06 DIAGNOSIS — E1169 Type 2 diabetes mellitus with other specified complication: Secondary | ICD-10-CM | POA: Diagnosis not present

## 2017-01-06 DIAGNOSIS — J432 Centrilobular emphysema: Secondary | ICD-10-CM | POA: Diagnosis not present

## 2017-01-06 DIAGNOSIS — I872 Venous insufficiency (chronic) (peripheral): Secondary | ICD-10-CM | POA: Diagnosis not present

## 2017-01-06 DIAGNOSIS — I890 Lymphedema, not elsewhere classified: Secondary | ICD-10-CM | POA: Diagnosis not present

## 2017-01-19 DIAGNOSIS — J449 Chronic obstructive pulmonary disease, unspecified: Secondary | ICD-10-CM | POA: Diagnosis not present

## 2017-01-25 DIAGNOSIS — I872 Venous insufficiency (chronic) (peripheral): Secondary | ICD-10-CM | POA: Diagnosis not present

## 2017-01-25 DIAGNOSIS — L97911 Non-pressure chronic ulcer of unspecified part of right lower leg limited to breakdown of skin: Secondary | ICD-10-CM | POA: Diagnosis not present

## 2017-01-25 DIAGNOSIS — L97921 Non-pressure chronic ulcer of unspecified part of left lower leg limited to breakdown of skin: Secondary | ICD-10-CM | POA: Diagnosis not present

## 2017-01-25 DIAGNOSIS — I890 Lymphedema, not elsewhere classified: Secondary | ICD-10-CM | POA: Diagnosis not present

## 2017-01-25 DIAGNOSIS — F17210 Nicotine dependence, cigarettes, uncomplicated: Secondary | ICD-10-CM | POA: Diagnosis not present

## 2017-01-25 DIAGNOSIS — I87303 Chronic venous hypertension (idiopathic) without complications of bilateral lower extremity: Secondary | ICD-10-CM | POA: Diagnosis not present

## 2017-01-30 DIAGNOSIS — I890 Lymphedema, not elsewhere classified: Secondary | ICD-10-CM | POA: Diagnosis not present

## 2017-01-30 DIAGNOSIS — L97229 Non-pressure chronic ulcer of left calf with unspecified severity: Secondary | ICD-10-CM | POA: Diagnosis not present

## 2017-01-30 DIAGNOSIS — L97219 Non-pressure chronic ulcer of right calf with unspecified severity: Secondary | ICD-10-CM | POA: Diagnosis not present

## 2017-01-30 DIAGNOSIS — I872 Venous insufficiency (chronic) (peripheral): Secondary | ICD-10-CM | POA: Diagnosis not present

## 2017-01-30 DIAGNOSIS — Z9119 Patient's noncompliance with other medical treatment and regimen: Secondary | ICD-10-CM | POA: Diagnosis not present

## 2017-09-14 ENCOUNTER — Emergency Department: Payer: Medicare Other

## 2017-09-14 ENCOUNTER — Encounter: Payer: Self-pay | Admitting: *Deleted

## 2017-09-14 ENCOUNTER — Emergency Department
Admission: EM | Admit: 2017-09-14 | Discharge: 2017-09-14 | Disposition: A | Discharge status: Home / Self Care | Payer: Medicare Other | Attending: Emergency Medicine | Admitting: Emergency Medicine

## 2017-09-14 ENCOUNTER — Other Ambulatory Visit: Payer: Self-pay

## 2017-09-14 DIAGNOSIS — R590 Localized enlarged lymph nodes: Secondary | ICD-10-CM | POA: Diagnosis not present

## 2017-09-14 DIAGNOSIS — R0789 Other chest pain: Secondary | ICD-10-CM | POA: Diagnosis present

## 2017-09-14 DIAGNOSIS — J449 Chronic obstructive pulmonary disease, unspecified: Secondary | ICD-10-CM | POA: Insufficient documentation

## 2017-09-14 DIAGNOSIS — F17210 Nicotine dependence, cigarettes, uncomplicated: Secondary | ICD-10-CM | POA: Insufficient documentation

## 2017-09-14 DIAGNOSIS — R0602 Shortness of breath: Secondary | ICD-10-CM | POA: Diagnosis not present

## 2017-09-14 DIAGNOSIS — I10 Essential (primary) hypertension: Secondary | ICD-10-CM | POA: Insufficient documentation

## 2017-09-14 DIAGNOSIS — I2510 Atherosclerotic heart disease of native coronary artery without angina pectoris: Secondary | ICD-10-CM | POA: Insufficient documentation

## 2017-09-14 DIAGNOSIS — R079 Chest pain, unspecified: Secondary | ICD-10-CM

## 2017-09-14 DIAGNOSIS — Q254 Congenital malformation of aorta unspecified: Secondary | ICD-10-CM

## 2017-09-14 HISTORY — DX: Tachycardia, unspecified: R00.0

## 2017-09-14 HISTORY — DX: Atherosclerotic heart disease of native coronary artery without angina pectoris: I25.10

## 2017-09-14 LAB — BASIC METABOLIC PANEL
Anion gap: 8 (ref 5–15)
BUN: 14 mg/dL (ref 8–23)
CALCIUM: 9.1 mg/dL (ref 8.9–10.3)
CO2: 26 mmol/L (ref 22–32)
Chloride: 101 mmol/L (ref 98–111)
Creatinine, Ser: 0.88 mg/dL (ref 0.61–1.24)
GFR calc Af Amer: >60 mL/min (ref >60)
GFR calc non Af Amer: >60 mL/min (ref >60)
Glucose, Bld: 141 mg/dL — ABNORMAL HIGH (ref 70–99)
Potassium: 4.1 mmol/L (ref 3.5–5.1)
Sodium: 135 mmol/L (ref 135–145)

## 2017-09-14 LAB — CBC
HEMATOCRIT: 50.6 % (ref 40.0–52.0)
Hemoglobin: 17.2 g/dL (ref 13.0–18.0)
MCH: 33.5 pg (ref 26.0–34.0)
MCHC: 34 g/dL (ref 32.0–36.0)
MCV: 98.5 fL (ref 80.0–100.0)
PLATELETS: 149 10*3/uL — AB (ref 150–440)
RBC: 5.14 MIL/uL (ref 4.40–5.90)
RDW: 15.1 % — AB (ref 11.5–14.5)
WBC: 9.4 10*3/uL (ref 3.8–10.6)

## 2017-09-14 LAB — TROPONIN I
Troponin I: <0.03 ng/mL (ref <0.03)
Troponin I: <0.03 ng/mL (ref <0.03)

## 2017-09-14 LAB — BRAIN NATRIURETIC PEPTIDE: B Natriuretic Peptide: 74 pg/mL (ref 0.0–100.0)

## 2017-09-14 MED ORDER — ASPIRIN 81 MG PO CHEW
324.0000 mg | CHEWABLE_TABLET | Freq: Once | ORAL | Status: AC
  Administered 2017-09-14: 243 mg via ORAL
  Filled 2017-09-14: qty 4

## 2017-09-14 MED ORDER — IOHEXOL 350 MG/ML SOLN
75.0000 mL | Freq: Once | INTRAVENOUS | Status: AC | PRN
  Administered 2017-09-14: 75 mL via INTRAVENOUS

## 2017-09-14 NOTE — ED Provider Notes (Signed)
[facility] Emergency Department Provider Note  ____________________________________________  Time seen: Approximately 6:13 PM  I have reviewed the triage vital signs and the nursing notes.   HISTORY  Chief Complaint Chest Pain    HPI Fernando [name] is a 62 y.o. male with a history of COPD, CAD, HTN, presenting for chest pain.  The patient reports that for the past 3 days, he has had a sharp chest pain over the left lateral chest wall that does not radiate and is not associated with any shortness of breath, palpitations, lightheadedness or syncope.  He has not had any change in his cough, fevers or chills.  He has not had any trauma or recent strain.  The chest pain completely resolves if he takes 3 Tylenols, but 6 or 7 hours later it does come back.  Past Medical History:  Diagnosis Date  . COPD (chronic obstructive pulmonary disease) (HCC)   . Coronary artery disease   . Hypertension   . Lymphedema   . Sleep apnea   . Tachycardia     There are no active problems to display for this patient.   History reviewed. No pertinent surgical history.  Current Outpatient Rx  . Order #: [id number] Class: Historical Med  . Order #: [id number] Class: Historical Med  . Order #: [id number] Class: Historical Med  . Order #: [id number] Class: Historical Med  . Order #: [id number] Class: Historical Med  . Order #: [id number] Class: Historical Med    Allergies Augmentin [amoxicillin-pot clavulanate] and Codeine  History reviewed. No pertinent family history.  Social History Social History   Tobacco Use  . Smoking status: Current Every Day Smoker    Packs/day: 1.50    Types: Cigarettes  . Smokeless tobacco: Never Used  Substance Use Topics  . Alcohol use: Not on file  . Drug use: Never    Review of Systems Constitutional: No fever/chills.  No lightheadedness or syncope.  No diaphoresis.  Eyes: No visual changes. ENT: No sore throat. No congestion or  rhinorrhea. Cardiovascular: Positive left lateral chest pain. Denies palpitations. Respiratory: Unchanged chronic shortness of breath.  No cough. Gastrointestinal: No abdominal pain.  No nausea, no vomiting.  No diarrhea.  No constipation. Genitourinary: Negative for dysuria. Musculoskeletal: Negative for back pain.  Swelling or calf pain. Skin: Negative for rash. Neurological: Negative for headaches. No focal numbness, tingling or weakness.     ____________________________________________   PHYSICAL EXAM:  VITAL SIGNS: ED Triage Vitals  Enc Vitals Group     BP 09/14/17 1652 (!) 132/56     Pulse Rate 09/14/17 1652 87     Resp 09/14/17 1652 16     Temp 09/14/17 1652 99 F (37.2 C)     Temp Source 09/14/17 1652 Oral     SpO2 09/14/17 1652 90 %     Weight 09/14/17 1653 269 lb (122 kg)     Height 09/14/17 1653 5\' 5"  (1.651 m)     Head Circumference --      Peak Flow --      Pain Score 09/14/17 1653 0     Pain Loc --      Pain Edu? --      Excl. in GC? --     Constitutional: Alert and oriented.  Answers questions appropriately.  Chronically ill-appearing, sitting on the edge of the bed with mild cyanosis of the lips. Eyes: Conjunctivae are normal.  Mild disconjugate gaze. No scleral icterus. Head: Atraumatic. Nose: No congestion/rhinnorhea. Mouth/Throat: Mucous membranes are moist.  Poor dentition. Neck: No stridor.  Supple.  Positive JVD.  No meningismus. Cardiovascular: Normal rate, regular rhythm. No murmurs, rubs or gallops.  Respiratory: Normal respiratory effort.  No accessory muscle use or retractions. Lungs CTAB.  No wheezes, rales or ronchi.  O2 sats are mid 90s on room air on my examination. Gastrointestinal: Morbidly obese.  Soft, nontender and nondistended.  No guarding or rebound.  No peritoneal signs. Musculoskeletal: No LE edema. No ttp in the calves or palpable cords.  Negative Homan's sign. Neurologic:  A&Ox3.  Speech is clear.  Face and smile are symmetric.   EOMI.  Moves all extremities well. Skin:  Skin is warm, dry and intact. No rash noted. Psychiatric: Mood and affect are normal. Speech and behavior are normal.  Normal judgement  ____________________________________________   LABS (all labs ordered are listed, but only abnormal results are displayed)  Labs Reviewed  BASIC METABOLIC PANEL - Abnormal; Notable for the following components:      Result Value   Glucose, Bld 141 (*)    All other components within normal limits  CBC - Abnormal; Notable for the following components:   RDW 15.1 (*)    Platelets 149 (*)    All other components within normal limits  TROPONIN I  TROPONIN I  BRAIN NATRIURETIC PEPTIDE   ____________________________________________  EKG  ED ECG REPORT I, Anne-Caroline Sharma Covert, the attending physician, personally viewed and interpreted this ECG.   Date: 09/14/2017  EKG Time: 1653  Rate: 89  Rhythm: normal sinus rhythm; + PVC's Axis: normal  Intervals:first-degree A-V block   ST&T Change: No STEMI  ____________________________________________  RADIOLOGY  Dg Chest 2 View  Result Date: 09/14/2017 CLINICAL DATA:  Chest pain. EXAM: CHEST - 2 VIEW COMPARISON:  None. FINDINGS: The heart size and mediastinal contours are within normal limits. No pneumothorax or pleural effusion is noted. Lungs are clear. The visualized skeletal structures are unremarkable. IMPRESSION: No active cardiopulmonary disease. Electronically Signed   By: Lupita Raider, M.D.   On: 09/14/2017 17:25   Ct Angio Chest Pe W And/or Wo Contrast  Result Date: 09/14/2017 CLINICAL DATA:  Chest pains for 3 days. EXAM: CT ANGIOGRAPHY CHEST WITH CONTRAST TECHNIQUE: Multidetector CT imaging of the chest was performed using the standard protocol during bolus administration of intravenous contrast. Multiplanar CT image reconstructions and MIPs were obtained to evaluate the vascular anatomy. CONTRAST:  75mL OMNIPAQUE IOHEXOL 350 MG/ML SOLN COMPARISON:   None. FINDINGS: Cardiovascular: There are no filling defects in the pulmonary arterial tree to suggest acute pulmonary thromboembolism. Maximal diameter of the ascending aorta is 4.0 cm. Atherosclerotic calcifications in the ascending aorta are noted. Great vessels are partially opacified with contrast. Origins are grossly patent. Prominent 3 vessel coronary artery calcifications. Mediastinum/Nodes: No pericardial effusion. No abnormal mediastinal adenopathy. Unremarkable thyroid and esophagus. Lungs/Pleura: No pneumothorax. No pleural effusion. There are blebs at the left lung apex. Scattered centrilobular emphysema in the anterior upper lobes. Subsegmental atelectasis towards the lung bases. Mild small airway thickening in the right lower lobe. Upper Abdomen: Fat containing mass in the left adrenal gland measures 2.4 cm and is consistent with a myelolipoma. 1.3 cm portal short axis diameter lymph node on image 82. Musculoskeletal: No vertebral compression deformity. Review of the MIP images confirms the above findings. IMPRESSION: No evidence of acute pulmonary thromboembolism. Maximal diameter of the ascending aorta is 4.0 cm. Recommend annual imaging followup by CTA or MRA. This recommendation follows 2010 ACCF/AHA/AATS/ACR/ASA/SCA/SCAI/SIR/STS/SVM Guidelines for the Diagnosis and Management  of Patients with Thoracic Aortic Disease. Circulation. 2010; 121: Z610-R604. Benign left adrenal myelolipoma. Mild upper abdominal adenopathy. This may be chronic however inflammatory or neoplastic etiology cannot be excluded. Consider 3 month follow-up to ensure stability and/or resolution. Aortic Atherosclerosis (ICD10-I70.0) and Emphysema (ICD10-J43.9). Electronically Signed   By: Jolaine Click M.D.   On: 09/14/2017 20:36    ____________________________________________   PROCEDURES  Procedure(s) performed: None  Procedures  Critical Care performed: No ____________________________________________   INITIAL  IMPRESSION / ASSESSMENT AND PLAN / ED COURSE  Pertinent labs & imaging results that were available during my care of the patient were reviewed by me and considered in my medical decision making (see chart for details).  62 y.o. male with multiple chronic comorbidities including CAD and COPD presenting for left lateral chest wall pain that is sharp.  Overall, the patient is hemodynamically stable and afebrile.  On my examination, the patient has no reproducible tenderness to palpation over the left lateral chest wall, and no pleuritic component to his chest pain.  He has no signs of ischemia on his EKG and a troponin is pending.  A CT scan has been ordered to evaluate for PE.  Aortic pathology is very unlikely.  Plan reevaluation for final disposition.  ----------------------------------------- 7:53 PM on 09/14/2017 -----------------------------------------  The patient is feeling much better at this time.  His troponin is negative and a second troponin is pending.  His chest x-ray shows no active cardiopulmonary disease and I am awaiting the results of his CT scan.  His electrolytes are within normal limits and his blood counts are reassuring.  Plan re-evaluation for final disposition.  ----------------------------------------- 9:05 PM on 09/14/2017 -----------------------------------------  The patient continues to be symptom-free and his CT scan does not show any PE.  At this time, the patient is safe for discharge home.  Turn precautions as well as follow-up instructions were discussed.  ____________________________________________  FINAL CLINICAL IMPRESSION(S) / ED DIAGNOSES  Final diagnoses:  Left-sided chest pain  Abnormality of thoracic aorta  Abdominal lymphadenopathy    Clinical Course as of Sep 15 2103  Thu Sep 14, 2017  1801 Potassium: 4.1 [MW]    Clinical Course User Index [MW] Blanca Friend, [place]      NEW MEDICATIONS STARTED DURING THIS VISIT:  New  Prescriptions   No medications on file      Rockne Menghini, MD 09/14/17 2105

## 2017-09-14 NOTE — ED Triage Notes (Addendum)
Pt to ED reporting chest pain x 3 days. Pt denies having pain at this time but reports his doctor sent him to the ED requesting a CT angio and troponin to be checked. Pt appears to have increased WOB in triage but denies SOB. Pt reports a congested cough with yellow phlegm. NO fevers or nasal drainage noted. Hx of MI in 1994 and HX of heart failure with daily leg swelling that has not changed recently per family and pt.

## 2017-09-14 NOTE — Discharge Instructions (Addendum)
Please make a follow-up appointment with your primary care physician for reevaluation of your chest pain, as well as follow-up for the abnormalities that were found in your CT scan today.  Today, the aorta and your chest, your thoracic aorta, appears enlarged and will need to be reevaluated.  In addition, you have some enlarged lymph nodes in your abdomen which will also need to be reimaged to evaluate for any change.  Return to the emergency department if you develop severe pain, lightheadedness or fainting, fever, or any other symptoms concerning to you.

## 2017-09-14 NOTE — ED Notes (Signed)
Pt took home carvedilol and digoxin

## 2017-09-14 NOTE — ED Notes (Signed)
Blue tube sent.

## 2017-09-20 ENCOUNTER — Encounter (INDEPENDENT_AMBULATORY_CARE_PROVIDER_SITE_OTHER): Payer: Self-pay | Admitting: Vascular Surgery

## 2018-07-09 ENCOUNTER — Other Ambulatory Visit: Payer: Self-pay | Admitting: Cardiology

## 2018-07-10 ENCOUNTER — Other Ambulatory Visit: Payer: Self-pay | Admitting: Cardiology

## 2018-07-10 DIAGNOSIS — I7121 Aneurysm of the ascending aorta, without rupture: Secondary | ICD-10-CM

## 2018-07-10 DIAGNOSIS — I712 Thoracic aortic aneurysm, without rupture: Secondary | ICD-10-CM

## 2018-09-11 ENCOUNTER — Other Ambulatory Visit: Payer: Self-pay

## 2018-09-11 ENCOUNTER — Ambulatory Visit
Admission: RE | Admit: 2018-09-11 | Discharge: 2018-09-11 | Disposition: A | Discharge status: Home / Self Care | Payer: Medicare Other | Source: Ambulatory Visit | Attending: Cardiology | Admitting: Cardiology

## 2018-09-11 DIAGNOSIS — I712 Thoracic aortic aneurysm, without rupture: Secondary | ICD-10-CM

## 2018-09-11 DIAGNOSIS — I7121 Aneurysm of the ascending aorta, without rupture: Secondary | ICD-10-CM

## 2018-09-11 LAB — POCT I-STAT CREATININE: Creatinine, Ser: 1 mg/dL (ref 0.61–1.24)

## 2018-09-11 MED ORDER — IOHEXOL 350 MG/ML SOLN
100.0000 mL | Freq: Once | INTRAVENOUS | Status: AC | PRN
  Administered 2018-09-11: 09:00:00 100 mL via INTRAVENOUS

## 2019-05-21 ENCOUNTER — Ambulatory Visit: Payer: Medicare Other

## 2019-09-06 ENCOUNTER — Other Ambulatory Visit: Payer: Self-pay | Admitting: Family Medicine

## 2019-09-06 DIAGNOSIS — Z136 Encounter for screening for cardiovascular disorders: Secondary | ICD-10-CM

## 2019-09-15 ENCOUNTER — Telehealth: Payer: Self-pay

## 2019-09-15 NOTE — Telephone Encounter (Signed)
Called patient for lung CT screening program after receiving referral from Dr. Gavin Potters.  I called home number and spoke to patient's wife.  She states they are interested in program as Dr. Gavin Potters told them about it, but may need to wait a bit before completing scan.  She explained that patient has some lingering bronchitis and increased shortness of breath with exacerbation and has had to sleep in recliner to get some rest.  She plans on talking with Dr. Gavin Potters about this and feels a CT scan would be a good thing to help him.  I explained that the CT scan with the screening clinic is a cancer screening tool, not an acute symptom tool.  She understands. She shares patient is a current smoker. He does have oxygen ordered as needed, but does not use it routinely.  She also shares that they have both been potentially exposed to COVID by a friend and the friend is waiting on results.  I have given her phone number to Glenna Fellows, lung navigator and she will call when she feels things have settled down a bit.  She thanked me for my call.

## 2019-09-16 ENCOUNTER — Other Ambulatory Visit: Payer: Self-pay | Admitting: Family Medicine

## 2019-09-16 DIAGNOSIS — I712 Thoracic aortic aneurysm, without rupture, unspecified: Secondary | ICD-10-CM

## 2019-09-16 DIAGNOSIS — F17200 Nicotine dependence, unspecified, uncomplicated: Secondary | ICD-10-CM

## 2019-09-17 ENCOUNTER — Ambulatory Visit: Payer: Medicare Other

## 2019-09-17 ENCOUNTER — Ambulatory Visit: Admission: RE | Admit: 2019-09-17 | Payer: Medicare Other | Source: Ambulatory Visit

## 2019-09-27 ENCOUNTER — Inpatient Hospital Stay: Admission: RE | Admit: 2019-09-27 | Payer: Medicare Other | Source: Ambulatory Visit

## 2019-09-27 ENCOUNTER — Ambulatory Visit: Admission: RE | Admit: 2019-09-27 | Payer: Medicare Other | Source: Ambulatory Visit

## 2019-10-11 ENCOUNTER — Telehealth: Payer: Self-pay

## 2019-10-11 NOTE — Telephone Encounter (Signed)
Attempted again to reach patient for lung CT screening clinic based on referral from [facility].  Message left for patient to call Glenna Fellows, lung navigator to schedule CT scan.

## 2019-10-15 NOTE — Telephone Encounter (Signed)
Wife called and reported that patient is going to have lung screening at a [facility] facility.

## 2020-03-03 DEATH — deceased
